# Patient Record
Sex: Female | Born: 1980 | Race: Black or African American | Hispanic: No | Marital: Single | Smoking: Never smoker
Health system: Southern US, Community
[De-identification: ages and names within clinical notes are randomized; demographics above are authoritative.]

## PROBLEM LIST (undated history)

## (undated) HISTORY — PX: CHOLECYSTECTOMY: SHX55

---

## 2015-03-09 ENCOUNTER — Emergency Department (INDEPENDENT_AMBULATORY_CARE_PROVIDER_SITE_OTHER): Payer: Medicaid Other

## 2015-03-09 ENCOUNTER — Encounter (HOSPITAL_COMMUNITY): Payer: Self-pay | Admitting: *Deleted

## 2015-03-09 ENCOUNTER — Emergency Department (HOSPITAL_COMMUNITY)
Admission: EM | Admit: 2015-03-09 | Discharge: 2015-03-09 | Disposition: A | Discharge status: Home / Self Care | Payer: Medicaid Other | Source: Home / Self Care | Attending: Family Medicine | Admitting: Family Medicine

## 2015-03-09 DIAGNOSIS — S92912A Unspecified fracture of left toe(s), initial encounter for closed fracture: Secondary | ICD-10-CM

## 2015-03-09 MED ORDER — HYDROCODONE-ACETAMINOPHEN 5-325 MG PO TABS: 1.0000 | ORAL_TABLET | Freq: Four times a day (QID) | ORAL | Status: DC | PRN

## 2015-03-09 NOTE — Discharge Instructions (Signed)
Ice , tape toes and wear shoe and use pain medicine as needed

## 2015-03-09 NOTE — ED Provider Notes (Signed)
CSN: [id number]     Arrival date & time 03/09/15  1427 History   First MD Initiated Contact with Patient 03/09/15 1505     Chief Complaint  Patient presents with  . Toe Injury   (Consider location/radiation/quality/duration/timing/severity/associated sxs/prior Treatment) Patient is a 34 y.o. female presenting with toe pain. The history is provided by the patient.  Toe Pain This is a new problem. The current episode started 3 to 5 hours ago (kicked while barefoot the bedpost to left 4th toe.). The problem has not changed since onset.   History reviewed. No pertinent past medical history. History reviewed. No pertinent past surgical history. History reviewed. No pertinent family history. History  Substance Use Topics  . Smoking status: Never Smoker   . Smokeless tobacco: Not on file  . Alcohol Use: No   OB History    No data available     Review of Systems  Constitutional: Negative.   Musculoskeletal: Positive for joint swelling and gait problem.  Skin: Negative.     Allergies  Review of patient's allergies indicates no known allergies.  Home Medications   Prior to Admission medications   Not on File   BP 128/89 mmHg  Pulse 82  Temp(Src) 98 F (36.7 C) (Oral)  Resp 18  SpO2 100%  LMP 03/08/2015 Physical Exam  Constitutional: She is oriented to person, place, and time. She appears well-developed and well-nourished.  Musculoskeletal: She exhibits tenderness.  Sl lat deformity to left 4th toe, skin nl. Limited rom.  Neurological: She is alert and oriented to person, place, and time.  Skin: Skin is warm and dry.  Nursing note and vitals reviewed.   ED Course  Procedures (including critical care time) Labs Review Labs Reviewed - No data to display  Imaging Review Dg Toe 4th Left  03/09/2015   CLINICAL DATA:  Patient hit left foot on table leg today; Pain and deformity 4th toe of left foot; Pt states 1 yr ago she fractured 5th toe of the left foot  EXAM: LEFT  FOURTH TOE  COMPARISON:  None.  FINDINGS: Oblique fracture of the distal aspect of the proximal phalanx of the left fourth toe. Extends from the medial mid shaft to the lateral distal aspect, but does not involve the articular surface. Fracture is slightly displaced with the distal fracture fragment displacing medially by 2 mm with slight lateral angulation. There is no comminution.  No other fractures.  No dislocation.  IMPRESSION: Oblique fracture of the proximal phalanx of the left fourth toe as detailed.   Electronically Signed   By: Amie Portlandavid  Ormond M.D.   On: 03/09/2015 15:35     MDM   1. Toe fracture, left, closed, initial encounter        Linna HoffJames D Tandy Lewin, MD 03/09/15 1547

## 2015-03-09 NOTE — ED Notes (Signed)
Pt  Reports    She injured  Her  l   4  Th  Toe     She  Reports she  Stubbed  It on a  Bed post     She  Has  Pain  When  She  Stands on the  Affected  Toe  Or    Moves  The  Toe

## 2015-07-14 ENCOUNTER — Emergency Department (HOSPITAL_COMMUNITY)
Admission: EM | Admit: 2015-07-14 | Discharge: 2015-07-14 | Disposition: A | Discharge status: Home / Self Care | Payer: Medicaid Other | Source: Home / Self Care | Attending: Emergency Medicine | Admitting: Emergency Medicine

## 2015-07-14 ENCOUNTER — Encounter (HOSPITAL_COMMUNITY): Payer: Self-pay | Admitting: Emergency Medicine

## 2015-07-14 ENCOUNTER — Emergency Department (INDEPENDENT_AMBULATORY_CARE_PROVIDER_SITE_OTHER): Payer: Medicaid Other

## 2015-07-14 DIAGNOSIS — M25561 Pain in right knee: Secondary | ICD-10-CM

## 2015-07-14 MED ORDER — DICLOFENAC SODIUM 50 MG PO TBEC: 50.0000 mg | DELAYED_RELEASE_TABLET | Freq: Three times a day (TID) | ORAL | Status: DC | PRN

## 2015-07-14 NOTE — ED Notes (Signed)
Patient stated she wanted xray done of her knee.   Per honig ok to get xray.   After xray patient will wait in lobby for results

## 2015-07-14 NOTE — Discharge Instructions (Signed)
You likely have arthritis in your knee that has caused a meniscal tear. Wear the knee brace when you are up and around. Take diclofenac 3 times a day as needed for pain. Ice your knee at the end of the day. If things are not improving in the next week, please follow-up with orthopedics.

## 2015-07-14 NOTE — ED Notes (Signed)
C/o right leg and arm pain for a month States she has leg stiffness in the morning States she hears pops when she walks Does have hx of arthritis  States she works at [employer] and does do some heavy lifting sometimes

## 2015-07-14 NOTE — ED Provider Notes (Signed)
CSN: [id number]     Arrival date & time 07/14/15  1730 History   First MD Initiated Contact with Patient 07/14/15 1850     Chief Complaint  Patient presents with  . Leg Pain  . Arm Pain   (Consider location/radiation/quality/duration/timing/severity/associated sxs/prior Treatment) HPI  She is a 34 year old woman here for evaluation of right knee pain and right forearm pain. She states she was told she had arthritis when she was in her 26s. She reports a long history appearing popping and cracking in her knees. Over the last month she has had worsening pain in her right knee. She states the pain is located in the lateral part of the knee. Sometimes the posterior and she will feel a fluid patch in the back of her knee. It will go down with rest and elevation. However, walking and bending her knee can be quite painful. Her right elbow and forearm will also hurt when her knee hurts sometimes. Today, she noted some skin changes on the medial elbow. These are described as darker skin patches.  She has been taking over-the-counter ibuprofen with some improvement.  History reviewed. No pertinent past medical history. History reviewed. No pertinent past surgical history. History reviewed. No pertinent family history. Social History  Substance Use Topics  . Smoking status: Never Smoker   . Smokeless tobacco: None  . Alcohol Use: No   OB History    No data available     Review of Systems As in history of present illness Allergies  Review of patient's allergies indicates no known allergies.  Home Medications   Prior to Admission medications   Medication Sig Start Date End Date Taking? Authorizing Provider  diclofenac (VOLTAREN) 50 MG EC tablet Take 1 tablet (50 mg total) by mouth 3 (three) times daily as needed for moderate pain. 07/14/15   Charm Rings, MD  HYDROcodone-acetaminophen (NORCO/VICODIN) 5-325 MG per tablet Take 1 tablet by mouth every 6 (six) hours as needed. Prn pain 03/09/15    Linna Hoff, MD   BP 128/78 mmHg  Pulse 101  Temp(Src) 98.4 F (36.9 C) (Oral)  Resp 16  SpO2 100%  LMP 07/13/2015 Physical Exam  Constitutional: She is oriented to person, place, and time. She appears well-developed and well-nourished. No distress.  Cardiovascular: Normal rate.   Pulmonary/Chest: Effort normal.  Musculoskeletal:  Right knee: No erythema or edema. No appreciable joint effusion. She does have some crepitus. No joint laxity. Positive McMurray's. Right arm. No erythema or edema. 2+ radial pulse. Full active range of motion of elbow and wrist without pain.  Neurological: She is alert and oriented to person, place, and time.  Skin:  She does have some hyperpigmentation along the medial aspect of her right elbow    ED Course  Procedures (including critical care time) Labs Review Labs Reviewed - No data to display  Imaging Review Dg Knee Complete 4 Views Right  07/14/2015   CLINICAL DATA:  Knee pain  EXAM: RIGHT KNEE - COMPLETE 4+ VIEW  COMPARISON:  None.  FINDINGS: Four views of the right knee submitted. No acute fracture or subluxation. Mid in the left ago. Anterior global a plate. No joint effusion.  IMPRESSION: No acute fracture or subluxation. Minimal narrowing of medial joint compartment   Electronically Signed   By: Natasha Mead M.D.   On: 07/14/2015 20:09     MDM   1. Right knee pain    I suspect she has some degenerative changes due to obesity.  Given her exam, I suspect she has a degenerative tear of her lateral meniscus. Knee sleeve given. Diclofenac 3 times a day as needed. Follow-up with orthopedics if no improvement in 1 week.    Charm Rings, MD 07/14/15 2013

## 2015-07-21 NOTE — ED Notes (Signed)
Call from pharmacy, asking for Rx that will be covered under patient insurance plan. Spoke ew Dr Kirby Funk, who authorized Meloxicacm 15 mg , PO, #30, use  1 pill QD for symptoms, NR. Spoke directly w pharmacist to relay new RX information

## 2015-10-30 ENCOUNTER — Ambulatory Visit: Payer: Medicaid Other | Admitting: Student

## 2016-09-14 ENCOUNTER — Ambulatory Visit (HOSPITAL_COMMUNITY)
Admission: EM | Admit: 2016-09-14 | Discharge: 2016-09-14 | Disposition: A | Discharge status: Home / Self Care | Payer: Medicaid Other | Attending: Family Medicine | Admitting: Family Medicine

## 2016-09-14 ENCOUNTER — Encounter (HOSPITAL_COMMUNITY): Payer: Self-pay | Admitting: Emergency Medicine

## 2016-09-14 DIAGNOSIS — J029 Acute pharyngitis, unspecified: Secondary | ICD-10-CM | POA: Diagnosis not present

## 2016-09-14 DIAGNOSIS — Z77120 Contact with and (suspected) exposure to mold (toxic): Secondary | ICD-10-CM

## 2016-09-14 LAB — POCT RAPID STREP A: Streptococcus, Group A Screen (Direct): NEGATIVE

## 2016-09-14 NOTE — ED Provider Notes (Signed)
CSN: [id number]     Arrival date & time 09/14/16  1525 History   None    Chief Complaint  Patient presents with  . Sore Throat   (Consider location/radiation/quality/duration/timing/severity/associated sxs/prior Treatment) HPI History obtained from patient:   LOCATION:throat SEVERITY:6 DURATION:1 day CONTEXT:sudden onset, exposed at school QUALITY:scratchy MODIFYING FACTORS:OTC meds without relief ASSOCIATED SYMPTOMS:hurts to swallow, fever, runny nose, cough TIMING:now constant  History reviewed. No pertinent past medical history. Past Surgical History:  Procedure Laterality Date  . CHOLECYSTECTOMY     History reviewed. No pertinent family history. Social History  Substance Use Topics  . Smoking status: Never Smoker  . Smokeless tobacco: Never Used  . Alcohol use No   OB History    No data available     Review of Systems  Denies: HEADACHE, NAUSEA, ABDOMINAL PAIN, CHEST PAIN, CONGESTION, DYSURIA, SHORTNESS OF BREATH  Allergies  Review of patient's allergies indicates no known allergies.  Home Medications   Prior to Admission medications   Medication Sig Start Date End Date Taking? Authorizing Provider  diclofenac (VOLTAREN) 50 MG EC tablet Take 1 tablet (50 mg total) by mouth 3 (three) times daily as needed for moderate pain. 07/14/15   Charm RingsErin J Honig, MD  HYDROcodone-acetaminophen (NORCO/VICODIN) 5-325 MG per tablet Take 1 tablet by mouth every 6 (six) hours as needed. Prn pain 03/09/15   Linna HoffJames D Kindl, MD   Meds Ordered and Administered this Visit  Medications - No data to display  BP 110/72 (BP Location: Left Arm)   Pulse 89   Temp 98.7 F (37.1 C) (Oral)   Resp 20   LMP 09/14/2016   SpO2 100%  No data found.   Physical Exam NURSES NOTES AND VITAL SIGNS REVIEWED. CONSTITUTIONAL: Well developed, well nourished, no acute distress HEENT: normocephalic, atraumatic EYES: Conjunctiva normal NECK:normal ROM, supple, no adenopathy PULMONARY:No respiratory  distress, normal effort ABDOMINAL: Soft, ND, NT BS+, No CVAT MUSCULOSKELETAL: Normal ROM of all extremities,  SKIN: warm and dry without rash PSYCHIATRIC: Mood and affect, behavior are normal;  Urgent Care Course   Clinical Course    Procedures (including critical care time)  Labs Review Labs Reviewed - No data to display  Imaging Review No results found.   Visual Acuity Review  Right Eye Distance:   Left Eye Distance:   Bilateral Distance:    Right Eye Near:   Left Eye Near:    Bilateral Near:         MDM   1. Pharyngitis, unspecified etiology   2. Mold exposure     Patient is reassured that there are no issues that require transfer to higher level of care at this time or additional tests. Patient is advised to continue home symptomatic treatment. Patient is advised that if there are new or worsening symptoms to attend the emergency department, contact primary care provider, or return to UC. Instructions of care provided discharged home in stable condition.    THIS NOTE WAS GENERATED USING A VOICE RECOGNITION SOFTWARE PROGRAM. ALL REASONABLE EFFORTS  WERE MADE TO PROOFREAD THIS DOCUMENT FOR ACCURACY.  I have verbally reviewed the discharge instructions with the patient. A printed AVS was given to the patient.  All questions were answered prior to discharge.      Tharon AquasFrank C Lelend Heinecke, PA 09/14/16 [phone]

## 2016-09-14 NOTE — Discharge Instructions (Signed)
YOU MAY NEED TO DEHUMIDIFY THE ROOM WHERE THE MOLD IS LOCATED.   LOOK ON THE WEB AT CDC.GOV/MOLD AND THIS WILL GIVE YOU GOOD INFORMATION ABOUT MOLD.

## 2016-09-14 NOTE — ED Triage Notes (Signed)
[facility] with a complaint of an itchy throat, burning watery eyes and headaches x 1 week that the patient believed to be attributed to mold in her home.

## 2016-09-17 LAB — CULTURE, GROUP A STREP (THRC)

## 2017-02-02 ENCOUNTER — Ambulatory Visit (HOSPITAL_COMMUNITY)
Admission: EM | Admit: 2017-02-02 | Discharge: 2017-02-02 | Disposition: A | Discharge status: Home / Self Care | Payer: Medicaid Other | Attending: Family Medicine | Admitting: Family Medicine

## 2017-02-02 ENCOUNTER — Encounter (HOSPITAL_COMMUNITY): Payer: Self-pay | Admitting: *Deleted

## 2017-02-02 DIAGNOSIS — J208 Acute bronchitis due to other specified organisms: Secondary | ICD-10-CM | POA: Diagnosis not present

## 2017-02-02 DIAGNOSIS — R05 Cough: Secondary | ICD-10-CM

## 2017-02-02 DIAGNOSIS — R059 Cough, unspecified: Secondary | ICD-10-CM

## 2017-02-02 DIAGNOSIS — R062 Wheezing: Secondary | ICD-10-CM

## 2017-02-02 MED ORDER — ALBUTEROL SULFATE HFA 108 (90 BASE) MCG/ACT IN AERS: 1.0000 | INHALATION_SPRAY | Freq: Four times a day (QID) | RESPIRATORY_TRACT | 0 refills | Status: DC | PRN

## 2017-02-02 MED ORDER — METHYLPREDNISOLONE 4 MG PO TBPK
ORAL_TABLET | ORAL | 0 refills | Status: DC
Start: 2017-02-02 — End: 2018-10-02

## 2017-02-02 MED ORDER — BENZONATATE 100 MG PO CAPS: 200.0000 mg | ORAL_CAPSULE | Freq: Three times a day (TID) | ORAL | 0 refills | Status: DC | PRN

## 2017-02-02 MED ORDER — AZITHROMYCIN 250 MG PO TABS: 250.0000 mg | ORAL_TABLET | Freq: Every day | ORAL | 0 refills | Status: DC

## 2017-02-02 NOTE — ED Triage Notes (Signed)
Cough  X  1   Week          Greenish  Sputum    Cough  Is   So  Severe  She  Is  Urinating  On  Herself   ough  Is  Worse  At  Night

## 2017-02-02 NOTE — ED Provider Notes (Signed)
CSN: [id number]     Arrival date & time 02/02/17  1006 History   First MD Initiated Contact with Patient 02/02/17 1140     Chief Complaint  Patient presents with  . Cough   (Consider location/radiation/quality/duration/timing/severity/associated sxs/prior Treatment) Patient c/o cough x 1 week.  She has used inhalers for sob and wheezing in past but is currently out.  She has a lot of night time coughing and she is coughing up green sputum. She states she is also wheezing.  She is no currently smoking.  The sx's have been present for a week.   The history is provided by the patient.  Cough  Cough characteristics:  Productive Sputum characteristics:  Yellow and green Severity:  Moderate Onset quality:  Sudden Duration:  1 week Timing:  Constant Progression:  Worsening Chronicity:  New Smoker: yes   Context: upper respiratory infection and weather changes   Relieved by:  Nothing Worsened by:  Nothing Ineffective treatments:  None tried   History reviewed. No pertinent past medical history. Past Surgical History:  Procedure Laterality Date  . CHOLECYSTECTOMY     History reviewed. No pertinent family history. Social History  Substance Use Topics  . Smoking status: Never Smoker  . Smokeless tobacco: Never Used  . Alcohol use No   OB History    No data available     Review of Systems  Constitutional: Positive for fatigue.  HENT: Negative.   Eyes: Negative.   Respiratory: Positive for cough.   Cardiovascular: Negative.   Gastrointestinal: Negative.   Endocrine: Negative.   Genitourinary: Negative.   Musculoskeletal: Negative.   Allergic/Immunologic: Negative.   Neurological: Negative.   Hematological: Negative.   Psychiatric/Behavioral: Negative.     Allergies  Patient has no known allergies.  Home Medications   Prior to Admission medications   Medication Sig Start Date End Date Taking? Authorizing Provider  albuterol (PROVENTIL HFA;VENTOLIN HFA) 108 (90  Base) MCG/ACT inhaler Inhale 1-2 puffs into the lungs every 6 (six) hours as needed for wheezing or shortness of breath. 02/02/17   Deatra Canter, FNP  azithromycin (ZITHROMAX) 250 MG tablet Take 1 tablet (250 mg total) by mouth daily. Take first 2 tablets together, then 1 every day until finished. 02/02/17   Deatra Canter, FNP  benzonatate (TESSALON) 100 MG capsule Take 2 capsules (200 mg total) by mouth 3 (three) times daily as needed for cough. 02/02/17   Deatra Canter, FNP  diclofenac (VOLTAREN) 50 MG EC tablet Take 1 tablet (50 mg total) by mouth 3 (three) times daily as needed for moderate pain. 07/14/15   Charm Rings, MD  HYDROcodone-acetaminophen (NORCO/VICODIN) 5-325 MG per tablet Take 1 tablet by mouth every 6 (six) hours as needed. Prn pain 03/09/15   Linna Hoff, MD  methylPREDNISolone (MEDROL DOSEPAK) 4 MG TBPK tablet Take 6-5-4-3-2-1 po qd 02/02/17   Deatra Canter, FNP   Meds Ordered and Administered this Visit  Medications - No data to display  BP 124/76 (BP Location: Right Arm)   Pulse 84   Temp 99.3 F (37.4 C) (Oral)   Resp 18   LMP 12/27/2016 Comment: btl  SpO2 100%  No data found.   Physical Exam  Constitutional: She appears well-developed and well-nourished.  HENT:  Head: Normocephalic and atraumatic.  Right Ear: External ear normal.  Left Ear: External ear normal.  Mouth/Throat: Oropharynx is clear and moist.  Eyes: Conjunctivae and EOM are normal. Pupils are equal, round, and reactive to  light.  Neck: Normal range of motion. Neck supple.  Cardiovascular: Normal rate, regular rhythm and normal heart sounds.   Pulmonary/Chest: Effort normal. She has wheezes.  Abdominal: Soft. Bowel sounds are normal.  Nursing note and vitals reviewed.   Urgent Care Course     Procedures (including critical care time)  Labs Review Labs Reviewed - No data to display  Imaging Review No results found.   Visual Acuity Review  Right Eye Distance:   Left Eye  Distance:   Bilateral Distance:    Right Eye Near:   Left Eye Near:    Bilateral Near:         MDM   1. Acute bronchitis due to other specified organisms   2. Cough   3. Wheezing    Zpak Medrol dose pack 4mg  #21 Tessalon perles Albuterol MDI  Push po fluids, rest, tylenol and motrin otc prn as directed for fever, arthralgias, and myalgias.  Follow up prn if sx's continue or persist.    Deatra CanterWilliam J Oxford, FNP 02/02/17 1212

## 2017-12-26 ENCOUNTER — Ambulatory Visit (HOSPITAL_COMMUNITY)
Admission: EM | Admit: 2017-12-26 | Discharge: 2017-12-26 | Disposition: A | Discharge status: Home / Self Care | Payer: Medicaid Other | Attending: Family Medicine | Admitting: Family Medicine

## 2017-12-26 ENCOUNTER — Other Ambulatory Visit: Payer: Self-pay

## 2017-12-26 ENCOUNTER — Encounter (HOSPITAL_COMMUNITY): Payer: Self-pay | Admitting: Physician Assistant

## 2017-12-26 DIAGNOSIS — B9789 Other viral agents as the cause of diseases classified elsewhere: Secondary | ICD-10-CM

## 2017-12-26 DIAGNOSIS — J069 Acute upper respiratory infection, unspecified: Secondary | ICD-10-CM | POA: Diagnosis not present

## 2017-12-26 MED ORDER — BENZONATATE 100 MG PO CAPS: 100.0000 mg | ORAL_CAPSULE | Freq: Three times a day (TID) | ORAL | 0 refills | Status: DC

## 2017-12-26 MED ORDER — FLUTICASONE PROPIONATE 50 MCG/ACT NA SUSP: 2.0000 | Freq: Every day | NASAL | 0 refills | Status: DC

## 2017-12-26 MED ORDER — IPRATROPIUM BROMIDE 0.06 % NA SOLN: 2.0000 | Freq: Four times a day (QID) | NASAL | 0 refills | Status: DC

## 2017-12-26 MED ORDER — CETIRIZINE-PSEUDOEPHEDRINE ER 5-120 MG PO TB12: 1.0000 | ORAL_TABLET | Freq: Every day | ORAL | 0 refills | Status: DC

## 2017-12-26 MED ORDER — ALBUTEROL SULFATE HFA 108 (90 BASE) MCG/ACT IN AERS: 1.0000 | INHALATION_SPRAY | Freq: Four times a day (QID) | RESPIRATORY_TRACT | 0 refills | Status: DC | PRN

## 2017-12-26 NOTE — Discharge Instructions (Signed)
Tessalon for cough. Albuterol as needed for shortness of breath, wheezing. Start flonase, atrovent nasal spray, zyrtec-D for nasal congestion/drainage. You can use over the counter nasal saline rinse such as neti pot for nasal congestion. Keep hydrated, your urine should be clear to pale yellow in color. Tylenol/motrin for fever and pain. Monitor for any worsening of symptoms, chest pain, shortness of breath, wheezing, swelling of the throat, follow up for reevaluation.   For sore throat try using a honey-based tea. Use 3 teaspoons of honey with juice squeezed from half lemon. Place shaved pieces of ginger into 1/2-1 cup of water and warm over stove top. Then mix the ingredients and repeat every 4 hours as needed.

## 2017-12-26 NOTE — ED Provider Notes (Signed)
[facility]    CSN: [id number] Arrival date & time: 12/26/17  1302     History   Chief Complaint Chief Complaint  Patient presents with  . Cough    HPI Kim Cross is a 37 y.o. female.   37 year old female comes in for 1 week history of URI symptoms. Productive cough, nasal congestion, rhinorrhea. Denies fever, chills, night sweats. Denies sore throat, ear pain. States coughing worse at night. otc cold medication without relief. Never smoker.       History reviewed. No pertinent past medical history.  There are no active problems to display for this patient.   Past Surgical History:  Procedure Laterality Date  . CHOLECYSTECTOMY      OB History    No data available       Home Medications    Prior to Admission medications   Medication Sig Start Date End Date Taking? Authorizing Provider  albuterol (PROVENTIL HFA;VENTOLIN HFA) 108 (90 Base) MCG/ACT inhaler Inhale 1-2 puffs into the lungs every 6 (six) hours as needed for wheezing or shortness of breath. 12/26/17   Cathie HoopsYu, Louvina Cleary V, PA-C  benzonatate (TESSALON) 100 MG capsule Take 1 capsule (100 mg total) by mouth every 8 (eight) hours. 12/26/17   Cathie HoopsYu, Cameron Katayama V, PA-C  cetirizine-pseudoephedrine (ZYRTEC-D) 5-120 MG tablet Take 1 tablet by mouth daily. 12/26/17   Cathie HoopsYu, Zymier Rodgers V, PA-C  diclofenac (VOLTAREN) 50 MG EC tablet Take 1 tablet (50 mg total) by mouth 3 (three) times daily as needed for moderate pain. Patient not taking: Reported on 12/26/2017 07/14/15   Charm RingsHonig, Erin J, MD  fluticasone [facility](FLONASE) 50 MCG/ACT nasal spray Place 2 sprays into both nostrils daily. 12/26/17   Cathie HoopsYu, Alaysiah Browder V, PA-C  HYDROcodone-acetaminophen (NORCO/VICODIN) 5-325 MG per tablet Take 1 tablet by mouth every 6 (six) hours as needed. Prn pain Patient not taking: Reported on 12/26/2017 03/09/15   Linna HoffKindl, James D, MD  ipratropium (ATROVENT) 0.06 % nasal spray Place 2 sprays into both nostrils 4 (four) times daily. 12/26/17   Belinda FisherYu, Marinda Tyer V, PA-C  methylPREDNISolone (MEDROL  DOSEPAK) 4 MG TBPK tablet Take 6-5-4-3-2-1 po qd Patient not taking: Reported on 12/26/2017 02/02/17   Deatra Canterxford, William J, FNP    Family History No family history on file.  Social History Social History   Tobacco Use  . Smoking status: Never Smoker  . Smokeless tobacco: Never Used  Substance Use Topics  . Alcohol use: No  . Drug use: No     Allergies   Patient has no known allergies.   Review of Systems Review of Systems  Reason unable to perform ROS: See HPI as above.     Physical Exam Triage Vital Signs ED Triage Vitals [12/26/17 1427]  Enc Vitals Group     BP (!) 140/99     Pulse Rate 94     Resp 18     Temp 98.3 F (36.8 C)     Temp src      SpO2 100 %     Weight      Height      Head Circumference      Peak Flow      Pain Score      Pain Loc      Pain Edu?      Excl. in GC?    No data found.  Updated Vital Signs BP (!) 140/99   Pulse 94   Temp 98.3 F (36.8 C)   Resp 18  SpO2 100%   Physical Exam  Constitutional: She is oriented to person, place, and time. She appears well-developed and well-nourished. No distress.  HENT:  Head: Normocephalic and atraumatic.  Right Ear: External ear and ear canal normal.  Left Ear: External ear and ear canal normal. Tympanic membrane is erythematous. Tympanic membrane is not bulging.  Nose: Mucosal edema and rhinorrhea present. Right sinus exhibits no maxillary sinus tenderness and no frontal sinus tenderness. Left sinus exhibits no maxillary sinus tenderness and no frontal sinus tenderness.  Mouth/Throat: Uvula is midline, oropharynx is clear and moist and mucous membranes are normal.  Right ear with cerumen impaction, TM not visible.   Eyes: Conjunctivae are normal. Pupils are equal, round, and reactive to light.  Neck: Normal range of motion. Neck supple.  Cardiovascular: Normal rate, regular rhythm and normal heart sounds. Exam reveals no gallop and no friction rub.  No murmur heard. Pulmonary/Chest:  Effort normal and breath sounds normal. She has no decreased breath sounds. She has no wheezes. She has no rhonchi. She has no rales.  Lymphadenopathy:    She has no cervical adenopathy.  Neurological: She is alert and oriented to person, place, and time.  Skin: Skin is warm and dry.  Psychiatric: She has a normal mood and affect. Her behavior is normal. Judgment normal.     UC Treatments / Results  Labs (all labs ordered are listed, but only abnormal results are displayed) Labs Reviewed - No data to display  EKG  EKG Interpretation None       Radiology No results found.  Procedures Procedures (including critical care time)  Medications Ordered in UC Medications - No data to display   Initial Impression / Assessment and Plan / UC Course  I have reviewed the triage vital signs and the nursing notes.  Pertinent labs & imaging results that were available during my care of the patient were reviewed by me and considered in my medical decision making (see chart for details).    Discussed with patient history and exam most consistent with viral URI. Symptomatic treatment as needed. Push fluids. Return precautions given.   Final Clinical Impressions(s) / UC Diagnoses   Final diagnoses:  Viral URI with cough    ED Discharge Orders        Ordered    albuterol (PROVENTIL HFA;VENTOLIN HFA) 108 (90 Base) MCG/ACT inhaler  Every 6 hours PRN     12/26/17 1441    benzonatate (TESSALON) 100 MG capsule  Every 8 hours     12/26/17 1441    fluticasone (FLONASE) 50 MCG/ACT nasal spray  Daily     12/26/17 1441    cetirizine-pseudoephedrine (ZYRTEC-D) 5-120 MG tablet  Daily     12/26/17 1441    ipratropium (ATROVENT) 0.06 % nasal spray  4 times daily     12/26/17 1441        Belinda Fisher, [place] 12/26/17 1446

## 2017-12-26 NOTE — ED Triage Notes (Signed)
Pt c/o coughing x1 week.  

## 2017-12-31 ENCOUNTER — Ambulatory Visit (HOSPITAL_COMMUNITY)
Admission: EM | Admit: 2017-12-31 | Discharge: 2017-12-31 | Disposition: A | Discharge status: Home / Self Care | Payer: Medicaid Other | Attending: Family Medicine | Admitting: Family Medicine

## 2017-12-31 ENCOUNTER — Encounter (HOSPITAL_COMMUNITY): Payer: Self-pay | Admitting: Emergency Medicine

## 2017-12-31 DIAGNOSIS — H6121 Impacted cerumen, right ear: Secondary | ICD-10-CM | POA: Diagnosis not present

## 2017-12-31 NOTE — Discharge Instructions (Signed)
Cerumen removed from ear today. No signs of infection. You can use flonase as needed if you continue to have ear fullness. Ear wax softener/hydrogen peroxide periodically can help prevent this from happening. Recheck as needed.

## 2017-12-31 NOTE — ED Triage Notes (Signed)
PT C/O: here for right ear fullness onset "couple of months".... Sx also include decreased hearing   Seen here on 12/26/17 for cold sx  A&O x4... NAD... Ambulatory

## 2017-12-31 NOTE — ED Provider Notes (Signed)
[facility]    CSN: [id number] Arrival date & time: 12/31/17  1355     History   Chief Complaint Chief Complaint  Patient presents with  . Ear Fullness    HPI Kim Cross is a 37 y.o. female.   37 year old female comes in for a few month history of right ear fullness with decreased hearing.  She was recently seen here for URI symptoms, which has since resolved.  She has been using Flonase as directed for her URI, and states has not helped with her right ear fullness.  Denies fever, chills, night sweats.  Denies cotton swab use, recent travels.      History reviewed. No pertinent past medical history.  There are no active problems to display for this patient.   Past Surgical History:  Procedure Laterality Date  . CHOLECYSTECTOMY      OB History    No data available       Home Medications    Prior to Admission medications   Medication Sig Start Date End Date Taking? Authorizing Provider  albuterol (PROVENTIL HFA;VENTOLIN HFA) 108 (90 Base) MCG/ACT inhaler Inhale 1-2 puffs into the lungs every 6 (six) hours as needed for wheezing or shortness of breath. 12/26/17  Yes Yu, Amy V, PA-C  benzonatate (TESSALON) 100 MG capsule Take 1 capsule (100 mg total) by mouth every 8 (eight) hours. 12/26/17  Yes Yu, Amy V, PA-C  cetirizine-pseudoephedrine (ZYRTEC-D) 5-120 MG tablet Take 1 tablet by mouth daily. 12/26/17  Yes Yu, Amy V, PA-C  diclofenac (VOLTAREN) 50 MG EC tablet Take 1 tablet (50 mg total) by mouth 3 (three) times daily as needed for moderate pain. 07/14/15  Yes Charm RingsHonig, Erin J, MD  fluticasone (FLONASE) 50 MCG/ACT nasal spray Place 2 sprays into both nostrils daily. 12/26/17  Yes Yu, Amy V, PA-C  HYDROcodone-acetaminophen (NORCO/VICODIN) 5-325 MG per tablet Take 1 tablet by mouth every 6 (six) hours as needed. Prn pain 03/09/15  Yes Kindl, Quita SkyeJames D, MD  ipratropium (ATROVENT) 0.06 % nasal spray Place 2 sprays into both nostrils 4 (four) times daily. 12/26/17  Yes Yu, Amy  V, PA-C  methylPREDNISolone (MEDROL DOSEPAK) 4 MG TBPK tablet Take 6-5-4-3-2-1 po qd 02/02/17  Yes Oxford, Anselm PancoastWilliam J, FNP    Family History History reviewed. No pertinent family history.  Social History Social History   Tobacco Use  . Smoking status: Never Smoker  . Smokeless tobacco: Never Used  Substance Use Topics  . Alcohol use: No  . Drug use: No     Allergies   Patient has no known allergies.   Review of Systems Review of Systems  Reason unable to perform ROS: See HPI as above.     Physical Exam Triage Vital Signs ED Triage Vitals [12/31/17 1528]  Enc Vitals Group     BP 115/62     Pulse Rate 79     Resp 16     Temp 98.6 F (37 C)     Temp Source Oral     SpO2 100 %     Weight      Height      Head Circumference      Peak Flow      Pain Score      Pain Loc      Pain Edu?      Excl. in GC?    No data found.  Updated Vital Signs BP 115/62 (BP Location: Left Arm)   Pulse 79   Temp 98.6  F (37 C) (Oral)   Resp 16   LMP 12/30/2017   SpO2 100%   Physical Exam  Constitutional: She is oriented to person, place, and time. She appears well-developed and well-nourished. No distress.  HENT:  Head: Normocephalic and atraumatic.  Right Ear: External ear and ear canal normal.  Left Ear: Tympanic membrane, external ear and ear canal normal. Tympanic membrane is not erythematous and not bulging.  No tenderness on palpation of tragus. Cerumen impaction of the right ear, TM not visible.   Post ear irrigation, right TM without erythema, bulging, mid ear effusion.   Eyes: Conjunctivae are normal. Pupils are equal, round, and reactive to light.  Neurological: She is alert and oriented to person, place, and time.     UC Treatments / Results  Labs (all labs ordered are listed, but only abnormal results are displayed) Labs Reviewed - No data to display  EKG  EKG Interpretation None       Radiology No results found.  Procedures Procedures  (including critical care time)  Medications Ordered in UC Medications - No data to display   Initial Impression / Assessment and Plan / UC Course  I have reviewed the triage vital signs and the nursing notes.  Pertinent labs & imaging results that were available during my care of the patient were reviewed by me and considered in my medical decision making (see chart for details).    Patient symptoms resolved after ear irrigation.  No signs of infection. Discussed ways to prevent cerumen impaction. Recheck as needed. Patient expresses understanding and agrees to plan.   Final Clinical Impressions(s) / UC Diagnoses   Final diagnoses:  Impacted cerumen of right ear    ED Discharge Orders    None        Belinda Fisher, PA-C 12/31/17 1611

## 2018-01-21 ENCOUNTER — Ambulatory Visit (HOSPITAL_COMMUNITY)
Admission: EM | Admit: 2018-01-21 | Discharge: 2018-01-21 | Disposition: A | Discharge status: Home / Self Care | Payer: Medicaid Other | Attending: Family Medicine | Admitting: Family Medicine

## 2018-01-21 ENCOUNTER — Other Ambulatory Visit: Payer: Self-pay

## 2018-01-21 ENCOUNTER — Encounter (HOSPITAL_COMMUNITY): Payer: Self-pay

## 2018-01-21 DIAGNOSIS — R05 Cough: Secondary | ICD-10-CM | POA: Diagnosis not present

## 2018-01-21 DIAGNOSIS — R059 Cough, unspecified: Secondary | ICD-10-CM

## 2018-01-21 MED ORDER — HYDROCODONE-HOMATROPINE 5-1.5 MG/5ML PO SYRP: 5.0000 mL | ORAL_SOLUTION | Freq: Four times a day (QID) | ORAL | 0 refills | Status: DC | PRN

## 2018-01-21 MED ORDER — AZITHROMYCIN 250 MG PO TABS: 250.0000 mg | ORAL_TABLET | Freq: Every day | ORAL | 0 refills | Status: DC

## 2018-01-21 NOTE — ED Triage Notes (Signed)
Pt presents today with the same issue she had on 12/31/17. States she is no better. Has a really bad productive cough with green thick mucus. Has SOB and wheezing. States she had some chills last night. Has used nasal spray and inhaler that was given at last visit with no relief.

## 2018-01-21 NOTE — Discharge Instructions (Signed)
Be aware, your cough medication may cause drowsiness. Please do not drive, operate heavy machinery or make important decisions while on this medication, it can cloud your judgement.  Please see your primary doctor or return here if not improving over the next one week.

## 2018-01-21 NOTE — ED Provider Notes (Signed)
  [facility]   [id number] 01/21/18 Arrival Time: 1202  ASSESSMENT & PLAN:  1. Cough     Meds ordered this encounter  Medications  . HYDROcodone-homatropine (HYCODAN) 5-1.5 MG/5ML syrup    Sig: Take 5 mLs by mouth every 6 (six) hours as needed for cough.    Dispense:  90 mL    Refill:  0  . azithromycin (ZITHROMAX) 250 MG tablet    Sig: Take 1 tablet (250 mg total) by mouth daily. Take first 2 tablets together, then 1 every day until finished.    Dispense:  6 tablet    Refill:  0   Given duration of symptoms. Cough medication sedation precautions. OTC symptom care as needed. May f/u with PCP or here as needed.  Reviewed expectations re: course of current medical issues. Questions answered. Outlined signs and symptoms indicating need for more acute intervention. Patient verbalized understanding. After Visit Summary given.   SUBJECTIVE: History from: patient.  Kim Cross is a 37 y.o. female who presents with complaint of a persistent dry, hacking cough. Onset gradual, approximately 3-4 week ago. Overall fatigued secondary to decreased sleep secondary to persistent coughing. SOB: none. Wheezing: she questions at times; has inhaler at home but hasn't needed to use. Fever: no. Overall normal PO intake without n/v. Sick contacts: no. OTC treatment: Mucinex DM without relief.  Social History   Tobacco Use  Smoking Status Never Smoker  Smokeless Tobacco Never Used    ROS: As per HPI.   OBJECTIVE:  Vitals:   01/21/18 1227  BP: 138/72  Pulse: 83  Resp: 16  Temp: 98.4 F (36.9 C)  TempSrc: Oral  SpO2: 100%    General appearance: alert; appears fatigued HEENT: nasal congestion; clear runny nose; throat irritation secondary to post-nasal drainage Neck: supple without LAD Lungs: unlabored respirations, symmetrical air entry; cough: mild to moderate; no respiratory distress Skin: warm and dry Psychological: alert and cooperative; normal mood and  affect  No Known Allergies  Social History   Socioeconomic History  . Marital status: Single    Spouse name: Not on file  . Number of children: Not on file  . Years of education: Not on file  . Highest education level: Not on file  Social Needs  . Financial resource strain: Not on file  . Food insecurity - worry: Not on file  . Food insecurity - inability: Not on file  . Transportation needs - medical: Not on file  . Transportation needs - non-medical: Not on file  Occupational History  . Not on file  Tobacco Use  . Smoking status: Never Smoker  . Smokeless tobacco: Never Used  Substance and Sexual Activity  . Alcohol use: No  . Drug use: No  . Sexual activity: Not on file  Other Topics Concern  . Not on file  Social History Narrative  . Not on file           Mardella LaymanHagler, Lucile Didonato, MD 01/21/18 1318

## 2018-10-03 ENCOUNTER — Ambulatory Visit (INDEPENDENT_AMBULATORY_CARE_PROVIDER_SITE_OTHER): Payer: Medicaid Other | Admitting: Family Medicine

## 2018-10-03 ENCOUNTER — Encounter: Payer: Self-pay | Admitting: Family Medicine

## 2018-10-03 VITALS — BP 145/95 | HR 87 | Resp 17 | Ht 68.0 in | Wt 249.4 lb

## 2018-10-03 DIAGNOSIS — M25551 Pain in right hip: Secondary | ICD-10-CM

## 2018-10-03 DIAGNOSIS — G8929 Other chronic pain: Secondary | ICD-10-CM

## 2018-10-03 DIAGNOSIS — M25561 Pain in right knee: Secondary | ICD-10-CM | POA: Diagnosis not present

## 2018-10-03 DIAGNOSIS — Z7689 Persons encountering health services in other specified circumstances: Secondary | ICD-10-CM

## 2018-10-03 DIAGNOSIS — M25562 Pain in left knee: Secondary | ICD-10-CM

## 2018-10-03 DIAGNOSIS — M25552 Pain in left hip: Secondary | ICD-10-CM

## 2018-10-03 NOTE — Patient Instructions (Addendum)
Thank you for choosing [facility] Care at [facility] to be your medical home!    Kim Cross was seen by Joaquin Courts, FNP today.   Jaedan Herrera's primary care provider is Bing Neighbors, FNP.   For the best care possible, you should try to see Joaquin Courts, FNP-C whenever you come to the clinic.   We look forward to seeing you again soon!  If you have any questions about your visit today, please call us at [phone] or feel free to reach your primary care provider via MyChart.      Musculoskeletal Pain Musculoskeletal pain is muscle and bone aches and pains. This pain can occur in any part of the body. Follow these instructions at home:  Only take medicines for pain, discomfort, or fever as told by your health care provider.  You may continue all activities unless the activities cause more pain. When the pain lessens, slowly resume normal activities. Gradually increase the intensity and duration of the activities or exercise.  During periods of severe pain, bed rest may be helpful. Lie or sit in any position that is comfortable, but get out of bed and walk around at least every several hours.  If directed, put ice on the injured area. ? Put ice in a plastic bag. ? Place a towel between your skin and the bag. ? Leave the ice on for 20 minutes, 2-3 times a day. Contact a health care provider if:  Your pain is getting worse.  Your pain is not relieved with medicines.  You lose function in the area of the pain if the pain is in your arms, legs, or neck. This information is not intended to replace advice given to you by your health care provider. Make sure you discuss any questions you have with your health care provider. Document Released: 11/08/2005 Document Revised: 04/20/2016 Document Reviewed: 07/13/2013 Elsevier Interactive Patient Education  2017 [employer].

## 2018-10-03 NOTE — Progress Notes (Signed)
Kim SalinaSynovia Cornacchia, is a 37 y.o. female  ZHY:[id number]:[id number]  EXB:[id number]  DOB - 03/29/1981  CC:  Chief Complaint  Patient presents with  . Establish Care  . Knee Pain    R knee pain x months. has a hx of a torn meniscus in the same knee. has swelling.       HPI: Kim Cross is a 37 y.o. female is here today to establish care.   Kim Cross has Chronic pain of right knee and Pain of both hip joints on their problem list.     Today's visit:  Here to establish today. Complains of bilateral knee pain and hip pain. Concern that she may have rheumatoid arthritis. Reports her mother suffers from RA. No prior work-up. She is obese. Current body mass index is 37.92 kg/m. Reports pain is present throughout the day. Reports crepitus in his knees when walking. Concern for laxity of her knees as sometime she feels as if she is going fall. She has taken ibuprofen and tylenol for pain with minimal relief.  Negative of fever or rashes.  Current medications: Current Outpatient Medications:  .  albuterol (PROVENTIL HFA;VENTOLIN HFA) 108 (90 Base) MCG/ACT inhaler, Inhale 1-2 puffs into the lungs every 6 (six) hours as needed for wheezing or shortness of breath., Disp: 1 Inhaler, Rfl: 0   Pertinent family medical history: Mother Arthritis   No Known Allergies  Social History   Socioeconomic History  . Marital status: Single    Spouse name: Not on file  . Number of children: Not on file  . Years of education: Not on file  . Highest education level: Not on file  Occupational History  . Not on file  Social Needs  . Financial resource strain: Not on file  . Food insecurity:    Worry: Not on file    Inability: Not on file  . Transportation needs:    Medical: Not on file    Non-medical: Not on file  Tobacco Use  . Smoking status: Never Smoker  . Smokeless tobacco: Never Used  Substance and Sexual Activity  . Alcohol use: No  . Drug use: No  . Sexual activity: Not on file  Lifestyle  . Physical  activity:    Days per week: Not on file    Minutes per session: Not on file  . Stress: Not on file  Relationships  . Social connections:    Talks on phone: Not on file    Gets together: Not on file    Attends religious service: Not on file    Active member of club or organization: Not on file    Attends meetings of clubs or organizations: Not on file    Relationship status: Not on file  . Intimate partner violence:    Fear of current or ex partner: Not on file    Emotionally abused: Not on file    Physically abused: Not on file    Forced sexual activity: Not on file  Other Topics Concern  . Not on file  Social History Narrative  . Not on file    Review of Systems: Constitutional: Negative for fever, chills, diaphoresis, activity change, appetite change and fatigue. Respiratory: Negative for cough, choking, chest tightness, shortness of breath, wheezing and stridor.  Cardiovascular: Negative for chest pain, palpitations and leg swelling. Musculoskeletal: Positive joint swelling, arthralgia and gait problem. Neurological: Negative for dizziness, tremors, seizures, syncope, facial asymmetry, speech difficulty, weakness, light-headedness, numbness and headaches.  Hematological: Negative for adenopathy. Does  not bruise/bleed easily. Psychiatric/Behavioral: Negative for hallucinations, behavioral problems, confusion, dysphoric mood, decreased concentration and agitation.    Objective:   Vitals:   10/03/18 1542  BP: (!) 145/95  Pulse: 87  Resp: 17  SpO2: 99%    BP Readings from Last 3 Encounters:  10/03/18 (!) 145/95  01/21/18 138/72  12/31/17 115/62    Filed Weights   10/03/18 1542  Weight: 249 lb 6.4 oz (113.1 kg)      Physical Exam: Constitutional: Patient appears well-developed and well-nourished. No distress. HENT: Normocephalic, atraumatic, External right and left ear normal. Oropharynx is clear and moist.  Eyes: Conjunctivae and EOM are normal. PERRLA, no  scleral icterus. Neck: Normal ROM. Neck supple. No JVD. No tracheal deviation. No thyromegaly. CVS: RRR, S1/S2 +, no murmurs, no gallops, no carotid bruit.  Pulmonary: Effort and breath sounds normal, no stridor, rhonchi, wheezes, rales.  Abdominal: Soft. BS +, no distension, tenderness, rebound or guarding.  Musculoskeletal:  Unable to elicit patellar reflexes. Generalized tenderness with palpation of knees. No edema or erythema or increased warmth of skin noted.  Neuro: Alert. Normal muscle tone coordination. Antalgic gait.  Skin: Skin is warm and dry. No rash noted. Not diaphoretic. No erythema. No pallor. Psychiatric: Normal mood and affect. Behavior, judgment, thought content normal.     Assessment and plan:  1. Encounter to establish care 2. Chronic pain of right knee 3. Chronic pain of left knee 4. Pain of both hip joints  Will check a rheumatoid panel today given patient complaint of generalized daily joint pain. Will add a Sedimentation Rate.  Ordering imaging. Once labs and imaging are received, will either extend steroid treatment or start antiinflammatory along with cyclobenzaprine for pain until patient is seen by orthopedics.  Dg Knee Complete 4 Views Left  Result Date: 10/05/2018 CLINICAL DATA:  Chronic left knee pain. EXAM: LEFT KNEE - COMPLETE 4+ VIEW COMPARISON:  None. FINDINGS: No acute fracture or dislocation. No joint effusion. Tiny marginal tricompartmental osteophytes. Joint spaces are preserved. Bone mineralization is normal. Soft tissues are unremarkable. IMPRESSION: 1. Minimal degenerative changes. Electronically Signed   By: Obie Dredge M.D.   On: 10/05/2018 16:13   Dg Knee Complete 4 Views Right  Result Date: 10/05/2018 CLINICAL DATA:  Bilateral knee pain. EXAM: RIGHT KNEE - COMPLETE 4+ VIEW COMPARISON:  07/14/2015. FINDINGS: No acute bony or joint abnormality identified. No evidence of fracture dislocation. Mild tricompartment degenerative change.  IMPRESSION: Mild tricompartment degenerative change.  No acute abnormality. Electronically Signed   By: Maisie Fus  Register   On: 10/05/2018 16:14      A total of 30 minutes spent, greater than 50 % of this time was spent counseling and coordination of care.     Return in about 8 weeks (around 11/28/2018), or CPE.   The patient was given clear instructions to go to ER or return to medical center if symptoms don't improve, worsen or new problems develop. The patient verbalized understanding. The patient was advised  to call and obtain lab results if they haven't heard anything from out office within 7-10 business days.  Joaquin Courts, FNP [facility] [address], [place] [address] [phone]: [phone]    This note has been created with Dragon speech recognition software and Paediatric nurse. Any transcriptional errors are unintentional.

## 2018-10-04 LAB — SEDIMENTATION RATE: Sed Rate: 94 mm/hr — ABNORMAL HIGH (ref 0–32)

## 2018-10-04 LAB — RA QN+CCP(IGG/A)+SJOSSA+SJOSSB
CYCLIC CITRULLIN PEPTIDE AB: 8 U (ref 0–19)
ENA SSB (LA) Ab: <0.2 AI (ref 0.0–0.9)
Rhuematoid fact SerPl-aCnc: <10 IU/mL (ref 0.0–13.9)

## 2018-10-05 ENCOUNTER — Ambulatory Visit (HOSPITAL_COMMUNITY)
Admission: RE | Admit: 2018-10-05 | Discharge: 2018-10-05 | Disposition: A | Discharge status: Home / Self Care | Payer: Medicaid Other | Source: Ambulatory Visit | Attending: Family Medicine | Admitting: Family Medicine

## 2018-10-05 DIAGNOSIS — G8929 Other chronic pain: Secondary | ICD-10-CM | POA: Insufficient documentation

## 2018-10-05 DIAGNOSIS — M25561 Pain in right knee: Secondary | ICD-10-CM | POA: Insufficient documentation

## 2018-10-05 DIAGNOSIS — M25562 Pain in left knee: Secondary | ICD-10-CM | POA: Diagnosis not present

## 2018-10-05 DIAGNOSIS — M25552 Pain in left hip: Secondary | ICD-10-CM

## 2018-10-05 DIAGNOSIS — M170 Bilateral primary osteoarthritis of knee: Secondary | ICD-10-CM | POA: Diagnosis not present

## 2018-10-05 DIAGNOSIS — M25551 Pain in right hip: Secondary | ICD-10-CM | POA: Insufficient documentation

## 2018-10-05 MED ORDER — MELOXICAM 15 MG PO TABS
15.0000 mg | ORAL_TABLET | Freq: Every day | ORAL | 1 refills | Status: DC
Start: 2018-10-05 — End: 2018-12-29

## 2018-10-05 MED ORDER — CYCLOBENZAPRINE HCL 5 MG PO TABS: 5.0000 mg | ORAL_TABLET | Freq: Three times a day (TID) | ORAL | 1 refills | Status: DC | PRN

## 2018-10-10 ENCOUNTER — Encounter (INDEPENDENT_AMBULATORY_CARE_PROVIDER_SITE_OTHER): Payer: Self-pay | Admitting: Orthopaedic Surgery

## 2018-10-10 ENCOUNTER — Telehealth: Payer: Self-pay | Admitting: Family Medicine

## 2018-10-10 ENCOUNTER — Ambulatory Visit (INDEPENDENT_AMBULATORY_CARE_PROVIDER_SITE_OTHER): Payer: Medicaid Other | Admitting: Orthopaedic Surgery

## 2018-10-10 VITALS — Ht 64.0 in | Wt 249.0 lb

## 2018-10-10 DIAGNOSIS — M25561 Pain in right knee: Secondary | ICD-10-CM

## 2018-10-10 DIAGNOSIS — M25562 Pain in left knee: Secondary | ICD-10-CM | POA: Diagnosis not present

## 2018-10-10 DIAGNOSIS — E66813 Obesity, class 3: Secondary | ICD-10-CM | POA: Insufficient documentation

## 2018-10-10 DIAGNOSIS — G8929 Other chronic pain: Secondary | ICD-10-CM

## 2018-10-10 DIAGNOSIS — Z6841 Body Mass Index (BMI) 40.0 and over, adult: Secondary | ICD-10-CM | POA: Diagnosis not present

## 2018-10-10 NOTE — Progress Notes (Signed)
Office Visit Note   Patient: Kim Cross           Date of Birth: 12/28/1980           MRN: [id number] Visit Date: 10/10/2018              Requested by: Bing NeighborsHarris, Kimberly S, FNP [address] [place], [place] [id number] PCP: Bing NeighborsHarris, Kimberly S, FNP   Assessment & Plan: Visit Diagnoses:  1. Chronic pain of both knees   2. Class 3 severe obesity due to excess calories without serious comorbidity with body mass index (BMI) of 40.0 to 44.9 in adult [facility])     Plan: Long discussion over 45 minutes predominately in counseling regarding bilateral knee osteoarthritis.  Have suggested weight loss and exercises.  We will send to physical therapy.  Also discussed different treatment options including cortisone.  Follow-Up Instructions: Return if symptoms worsen or fail to improve.   Orders:  No orders of the defined types were placed in this encounter.  No orders of the defined types were placed in this encounter.     Procedures: No procedures performed   Clinical Data: No additional findings.   Subjective: Chief Complaint  Patient presents with  . New Patient (Initial Visit)    BI LAT KNEE PAIN FOR 2 YRS NO INJURY, NO PRIOR INJECTIONS OR SURGERY. R KNEE IS WORSE AND PAIN RADIATES UP TO R HIP, UNABLE TO SLEEP ON R HIP. BI LAT FEET PAIN AND NUMBNESS.   37 year old female visited the office for evaluation of bilateral knee pain.  Mrs. Kim Cross relates that she is had some discomfort was a "child".  She denies any history of injury or trauma.  She has difficulty with deep knee bends and stairs.  Pain is predominant along the anterior aspect of her knees and also some medially.  She was recently evaluated by her family physician with x-rays.  These were reviewed.  There is evidence of osteophyte formation along the medial compartments.  The joint spaces are well-maintained.  No ectopic calcification.  She also has had some recent lab work per her primary care physician and is awaiting  the results. She has tried over-the-counter medicines with some relief.  HPI  Review of Systems  Constitutional: Negative for fatigue and fever.  HENT: Negative for ear pain.   Eyes: Negative for pain.  Respiratory: Negative for cough and shortness of breath.   Cardiovascular: Positive for leg swelling.  Gastrointestinal: Negative for constipation and diarrhea.  Genitourinary: Negative for difficulty urinating.  Musculoskeletal: Positive for back pain and neck pain.  Skin: Positive for rash.  Allergic/Immunologic: Positive for food allergies.  Neurological: Positive for weakness. Negative for numbness.  Hematological: Bruises/bleeds easily.  Psychiatric/Behavioral: Positive for sleep disturbance.     Objective: Vital Signs: Ht 5\' 4"  (1.626 m)   Wt 249 lb (112.9 kg)   BMI 42.74 kg/m   Physical Exam  Constitutional: She is oriented to person, place, and time. She appears well-developed and well-nourished.  HENT:  Mouth/Throat: Oropharynx is clear and moist.  Eyes: Pupils are equal, round, and reactive to light. EOM are normal.  Pulmonary/Chest: Effort normal.  Neurological: She is alert and oriented to person, place, and time.  Skin: Skin is warm and dry.  Psychiatric: She has a normal mood and affect. Her behavior is normal.    Ortho Exam awake alert and oriented x3 comfortable sitting.  BMI is 42.  She is trying to lose some weight and exercise.  Large knees.  Considerable patellar crepitation and pain with patella compression.  Minimal medial joint pain.  Full extension flexion at least 100 degrees without instability.  No popliteal pain.  Straight leg raise negative.  No pain with range of motion of either hip.  Neurologically intact Specialty Comments:  No specialty comments available.  Imaging: No results found.   PMFS History: Patient Active Problem List   Diagnosis Date Noted  . Class 3 severe obesity due to excess calories without serious comorbidity with body  mass index (BMI) of 40.0 to 44.9 in adult (HCC) 10/10/2018  . Chronic pain of both knees 10/05/2018  . Pain of both hip joints 10/05/2018   History reviewed. No pertinent past medical history.  History reviewed. No pertinent family history.  Past Surgical History:  Procedure Laterality Date  . CHOLECYSTECTOMY     Social History   Occupational History  . Not on file  Tobacco Use  . Smoking status: Never Smoker  . Smokeless tobacco: Never Used  Substance and Sexual Activity  . Alcohol use: Yes    Comment: OCC  . Drug use: No  . Sexual activity: Not on file      ,7Y,,

## 2018-10-10 NOTE — Telephone Encounter (Signed)
Patient called requesting lab results, please follow up °

## 2018-10-11 ENCOUNTER — Other Ambulatory Visit (INDEPENDENT_AMBULATORY_CARE_PROVIDER_SITE_OTHER): Payer: Self-pay | Admitting: Radiology

## 2018-10-11 ENCOUNTER — Telehealth (INDEPENDENT_AMBULATORY_CARE_PROVIDER_SITE_OTHER): Payer: Self-pay | Admitting: Orthopaedic Surgery

## 2018-10-11 DIAGNOSIS — G8929 Other chronic pain: Secondary | ICD-10-CM

## 2018-10-11 DIAGNOSIS — M25561 Pain in right knee: Secondary | ICD-10-CM

## 2018-10-11 DIAGNOSIS — M25562 Pain in left knee: Principal | ICD-10-CM

## 2018-10-11 NOTE — Telephone Encounter (Signed)
Will send in new referral

## 2018-10-11 NOTE — Telephone Encounter (Signed)
Kim Cross and [facility] called to thank you for the referral, but they do not contact with adult Medicaid. Patient will need to be seen somewhere else.

## 2018-10-24 ENCOUNTER — Ambulatory Visit: Payer: Medicaid Other | Admitting: Physical Therapy

## 2018-10-26 ENCOUNTER — Ambulatory Visit: Payer: Medicaid Other | Attending: Orthopaedic Surgery | Admitting: Physical Therapy

## 2018-10-26 ENCOUNTER — Other Ambulatory Visit: Payer: Self-pay

## 2018-10-26 ENCOUNTER — Encounter: Payer: Self-pay | Admitting: Physical Therapy

## 2018-10-26 DIAGNOSIS — M25662 Stiffness of left knee, not elsewhere classified: Secondary | ICD-10-CM | POA: Insufficient documentation

## 2018-10-26 DIAGNOSIS — M25561 Pain in right knee: Secondary | ICD-10-CM | POA: Insufficient documentation

## 2018-10-26 DIAGNOSIS — G8929 Other chronic pain: Secondary | ICD-10-CM | POA: Diagnosis present

## 2018-10-26 DIAGNOSIS — M25562 Pain in left knee: Secondary | ICD-10-CM | POA: Diagnosis present

## 2018-10-26 DIAGNOSIS — M25661 Stiffness of right knee, not elsewhere classified: Secondary | ICD-10-CM | POA: Diagnosis present

## 2018-10-26 DIAGNOSIS — R2689 Other abnormalities of gait and mobility: Secondary | ICD-10-CM | POA: Diagnosis present

## 2018-10-26 NOTE — Therapy (Signed)
[facility] [address] [place], [place], [address] Phone: [phone]   Fax:  [phone]  Physical Therapy Evaluation  Patient Details  Name: Kim Cross MRN: [id number] Date of Birth: [birth year] Referring Provider (PT): Valeria Batman   Encounter Date: 10/26/2018  PT End of Session - 10/26/18 1604    Visit Number  1    Number of Visits  11    Date for PT Re-Evaluation  01/04/19    Authorization Type  Medicaid-auth required    PT Start Time  1515    PT Stop Time  1558    PT Time Calculation (min)  43 min    Activity Tolerance  Patient tolerated treatment well    Behavior During Therapy  [facility] for tasks assessed/performed       History reviewed. No pertinent past medical history.  Past Surgical History:  Procedure Laterality Date  . CHOLECYSTECTOMY      There were no vitals filed for this visit.   Subjective Assessment - 10/26/18 1518    Subjective  Pt is a 37 y/o female who presents to OPPT for bil knee pain with Rt worse than Lt.  C/o difficulty with climbing stairs and shooting pain up into hip.  Pt states pain is chronic x ~15 years, worsening symptoms x 2 years.      Limitations  Walking    How long can you sit comfortably?  pain with transition to standing    How long can you walk comfortably?  "bothers me at times"    Diagnostic tests  xrays: OA bil knees    Patient Stated Goals  exercises to do to avoid surgery    Currently in Pain?  Yes    Pain Score  8    up to 9/10; at best 8/10   Pain Orientation  Right;Left;Anterior;Posterior   Rt>Lt   Pain Descriptors / Indicators  Cramping;Spasm;Shooting;Sharp;Dull   "severe stiffness"   Pain Type  Chronic pain    Pain Onset  More than a month ago    Pain Frequency  Constant    Aggravating Factors   stairs, walking, sit to stand, difficulty sleeping on Rt side    Pain Relieving Factors  ice, medication, elevation         [facility] PT Assessment  - 10/26/18 1523      Assessment   Medical Diagnosis  M25.561,G89.29 (ICD-10-CM) - Chronic pain of right knee; M25.562,G89.29 (ICD-10-CM) - Chronic pain of left knee    Referring Provider (PT)  Cleophas Dunker, PETER W    Onset Date/Surgical Date  --   chronic x 15 years (worse x 2 years)   Next MD Visit  PRN    Prior Therapy  none      Precautions   Precautions  None      Restrictions   Weight Bearing Restrictions  No      Balance Screen   Has the patient fallen in the past 6 months  No    Has the patient had a decrease in activity level because of a fear of falling?   Yes    Is the patient reluctant to leave their home because of a fear of falling?   No      Home Environment   Living Environment  Private residence    Living Arrangements  Children   60 y/o son   Type of Home  House    Home Access  Stairs to enter  Entrance Stairs-Number of Steps  4    Entrance Stairs-Rails  Right;Left;Cannot reach both    Home Layout  One level      Prior Function   Level of Independence  Independent    Vocation  Unemployed   was working at [employer] but unable to continue   Leisure  bike riding, go to park, movies; regular exercise: none      Cognition   Overall Cognitive Status  Within Functional Limits for tasks assessed      Posture/Postural Control   Posture/Postural Control  Postural limitations    Postural Limitations  Rounded Shoulders;Forward head      ROM / Strength   AROM / PROM / Strength  AROM;Strength      AROM   Overall AROM Comments  measured in sitting    AROM Assessment Site  Knee    Right/Left Knee  Right;Left    Right Knee Extension  -8    Right Knee Flexion  87    Left Knee Extension  0    Left Knee Flexion  90      Strength   Overall Strength Comments  submaximal effort noted with knee testing    Strength Assessment Site  Hip;Knee;Ankle    Right/Left Hip  Right;Left    Right Hip Flexion  4/5    Right Hip Extension  3/5    Right Hip ABduction  4/5    Left Hip  Flexion  4/5    Left Hip Extension  3/5    Left Hip ABduction  4/5    Right/Left Knee  Right;Left    Right Knee Flexion  3+/5    Right Knee Extension  3+/5    Left Knee Flexion  4/5    Left Knee Extension  4/5    Right/Left Ankle  Right;Left    Right Ankle Dorsiflexion  4/5    Left Ankle Dorsiflexion  5/5      Flexibility   Soft Tissue Assessment /Muscle Length  yes   tightness in gastrocs   Hamstrings  tightness bil      Palpation   Patella mobility  lateral tracking bil    Palpation comment  mild pain medial joint line bil      Transfers   Comments  increased time and pain noted with sit to stand      Ambulation/Gait   Gait Pattern  Antalgic                Objective measurements completed on examination: See above findings.      [facility] Adult PT Treatment/Exercise - 10/26/18 1523      Self-Care   Self-Care  Other Self-Care Comments    Other Self-Care Comments   benefits and concerns about using knee brace; educated on TENS unit and how to purchase; recommend aquatic exercise and pt open to persuing      Exercises   Exercises  Knee/Hip      Knee/Hip Exercises: Stretches   Passive Hamstring Stretch  Both;2 reps;30 seconds    Passive Hamstring Stretch Limitations  seated    Gastroc Stretch  Both;2 reps;30 seconds      Knee/Hip Exercises: Seated   Long Arc Quad  5 reps;Both      Knee/Hip Exercises: Supine   Bridges  5 reps    Straight Leg Raise with External Rotation  Both;5 reps    Straight Leg Raise with External Rotation Limitations  increased difficulty with Rt  PT Education - 10/26/18 1604    Education Details  HEP, see self care    Person(s) Educated  Patient    Methods  Explanation;Handout;Demonstration    Comprehension  Verbalized understanding;Returned demonstration       PT Short Term Goals - 10/26/18 1609      PT SHORT TERM GOAL #1   Title  independent with initial HEP    Baseline  no HEP    Status  New    Target  Date  11/21/18      PT SHORT TERM GOAL #2   Title  report pain < 7/10 with ADLs for improved function    Baseline  pain 8/10 at best, 9/10 with activity    Status  New    Target Date  11/21/18      PT SHORT TERM GOAL #3   Title  bil knee flexion improved to at least 105 degrees for improved function and mobility    Baseline  Rt: 87 degrees; Lt: 90 degrees    Status  New    Target Date  11/21/18        PT Long Term Goals - 10/26/18 1612      PT LONG TERM GOAL #1   Title  independent with advanced HEP    Baseline  no HEP    Status  New    Target Date  12/21/18      PT LONG TERM GOAL #2   Title  negotiate stairs with pain < 6/10 for improved function    Baseline  pain 8-9/10 at rest and with activity    Status  New    Target Date  12/21/18      PT LONG TERM GOAL #3   Title  report 50% improvement in sleep for better productivity and improved function    Baseline  difficulty sleeping at night with increased pain and unable to find comfortable positions    Status  New    Target Date  12/21/18      PT LONG TERM GOAL #4   Title  demonstrate 4+/5 knee strength for improved function    Baseline  Rt knee: 3+/5; Lt knee: 4/5    Status  New    Target Date  12/21/18             Plan - 10/26/18 1600    Clinical Impression Statement  Pt is a 37 y/o female who presents to OPPT for bil knee pain due to OA.  Pt demonstrates decreased strength and ROM as well as gait abnormalities due to pain.  Pt will benefit from PT to address deficits listed.    Clinical Presentation  Stable    Clinical Decision Making  Low    Rehab Potential  Good    PT Frequency  2x / week   1x/wk x 3 wks then 2x/wk x 4 wks   PT Duration  6 weeks    PT Treatment/Interventions  ADLs/Self Care Home Management;Cryotherapy;Electrical Stimulation;Iontophoresis 4mg /ml Dexamethasone;Moist Heat;Ultrasound;Therapeutic exercise;Therapeutic activities;Functional mobility training;Stair training;Gait  training;Neuromuscular re-education;Patient/family education;Manual techniques;Passive range of motion;Dry needling;Taping;Vasopneumatic Device    PT Next Visit Plan  review HEP, give exercises to perform in pool if pt still interested, progress strengthening and flexibility exercises, manual/modalities PRN    PT Home Exercise Plan  Access Code: [email or web address]    Consulted and Agree with Plan of Care  Patient       Patient will benefit from skilled therapeutic intervention in order to improve the  following deficits and impairments:  Abnormal gait, Pain, Increased fascial restricitons, Decreased mobility, Decreased range of motion, Decreased activity tolerance, Decreased endurance, Decreased strength, Impaired flexibility, Difficulty walking  Visit Diagnosis: Chronic pain of right knee - Plan: PT plan of care cert/re-cert  Chronic pain of left knee - Plan: PT plan of care cert/re-cert  Stiffness of right knee, not elsewhere classified - Plan: PT plan of care cert/re-cert  Stiffness of left knee, not elsewhere classified - Plan: PT plan of care cert/re-cert  Other abnormalities of gait and mobility - Plan: PT plan of care cert/re-cert     Problem List Patient Active Problem List   Diagnosis Date Noted  . Class 3 severe obesity due to excess calories without serious comorbidity with body mass index (BMI) of 40.0 to 44.9 in adult (HCC) 10/10/2018  . Chronic pain of both knees 10/05/2018  . Pain of both hip joints 10/05/2018      Clarita CraneStephanie F Medina Degraffenreid, PT, DPT 10/26/18 4:17 PM      [facility] [address] [place], [place], [address] Phone: [phone]   Fax:  [phone]  Name: Kim Cross MRN: [id number] Date of Birth: 07/27/1981

## 2018-10-26 NOTE — Patient Instructions (Signed)
Access Code: [email or web address]  URL: [email or web address]  Date: 10/26/2018  Prepared by: Moshe CiproStephanie Azaliah Carrero   Exercises  Supine Bridge - 10 reps - 1 sets - 5 sec hold - 2x daily - 7x weekly  Straight Leg Raise with External Rotation - 10 reps - 1 sets - 2-3 sec hold - 2x daily - 7x weekly  Seated Long Arc Quad - 10 reps - 1 sets - 2x daily - 7x weekly  Gastroc Stretch on Wall - 3 reps - 1 sets - 30 sec hold - 2x daily - 7x weekly  Seated Hamstring Stretch - 3 reps - 1 sets - 30 sec hold - 2x daily - 7x weekly  Patient Education  TENS Unit

## 2018-10-27 NOTE — Progress Notes (Signed)
Mailed unable to contact letter.

## 2018-11-28 ENCOUNTER — Ambulatory Visit: Payer: Medicaid Other | Admitting: Family Medicine

## 2018-11-30 ENCOUNTER — Encounter (HOSPITAL_COMMUNITY): Payer: Self-pay | Admitting: Emergency Medicine

## 2018-11-30 ENCOUNTER — Ambulatory Visit (INDEPENDENT_AMBULATORY_CARE_PROVIDER_SITE_OTHER): Payer: Medicaid Other

## 2018-11-30 ENCOUNTER — Other Ambulatory Visit: Payer: Self-pay

## 2018-11-30 ENCOUNTER — Ambulatory Visit (HOSPITAL_COMMUNITY)
Admission: EM | Admit: 2018-11-30 | Discharge: 2018-11-30 | Disposition: A | Discharge status: Home / Self Care | Payer: Medicaid Other | Attending: Family Medicine | Admitting: Family Medicine

## 2018-11-30 DIAGNOSIS — R0789 Other chest pain: Secondary | ICD-10-CM | POA: Diagnosis not present

## 2018-11-30 NOTE — ED Triage Notes (Signed)
PT reports intermittent stabbing left chest pain for 2 weeks. SOB occurs with chest pain. No pain radiation.     Headaches as well.

## 2018-11-30 NOTE — Discharge Instructions (Addendum)
Your EKG and chest x-ray were normal I believe that your pain is likely either related to a deep muscle pain or stress. You can try taking some ibuprofen, 600 mg every 6 hours for pain inflammation If you are having some anxiety or high stressful situation you could try taking a Benadryl as this has been proven to likely relax you.  It will also help you rest. Follow-up with your primary care for continued or worsening symptoms For worsening chest pain, shortness of breath or palpitations please go the ER

## 2018-11-30 NOTE — ED Provider Notes (Signed)
[facility]    CSN: [id number] Arrival date & time: 11/30/18  1614     History   Chief Complaint Chief Complaint  Patient presents with  . Chest Pain  . Headache    HPI Kim Cross is a 38 y.o. female.   Patient is a 38 year old female with no significant past medical history.  She presents today with sharp stabbing left-sided chest pain that is been intermittent over the past 2 weeks.  She denies anything exacerbating the symptoms.  She has not done anything to treat the symptoms.  Reports sometimes she feels like it is hard to catch her breath.  Denies any cough, congestion, fevers.  Denies any palpitations.  Denies any recent traveling long distances, hormone use or smoking.  She does not feel as if her heart is been racing.  She admits to being under a lot of stress at this time taking care of her mother.  She is wheelchair-bound and requires a lot of lifting and moving parts.  ROS per HPI      History reviewed. No pertinent past medical history.  Patient Active Problem List   Diagnosis Date Noted  . Class 3 severe obesity due to excess calories without serious comorbidity with body mass index (BMI) of 40.0 to 44.9 in adult (HCC) 10/10/2018  . Chronic pain of both knees 10/05/2018  . Pain of both hip joints 10/05/2018    Past Surgical History:  Procedure Laterality Date  . CHOLECYSTECTOMY      OB History   No obstetric history on file.      Home Medications    Prior to Admission medications   Medication Sig Start Date End Date Taking? Authorizing Provider  albuterol (PROVENTIL HFA;VENTOLIN HFA) 108 (90 Base) MCG/ACT inhaler Inhale 1-2 puffs into the lungs every 6 (six) hours as needed for wheezing or shortness of breath. 12/26/17   Cathie Hoops, Amy V, PA-C  cyclobenzaprine (FLEXERIL) 5 MG tablet Take 1 tablet (5 mg total) by mouth 3 (three) times daily as needed for muscle spasms. 10/05/18   Bing Neighbors, FNP  meloxicam (MOBIC) 15 MG tablet Take 1  tablet (15 mg total) by mouth daily. 10/05/18   Bing Neighbors, FNP    Family History History reviewed. No pertinent family history.  Social History Social History   Tobacco Use  . Smoking status: Never Smoker  . Smokeless tobacco: Never Used  Substance Use Topics  . Alcohol use: Yes    Comment: OCC  . Drug use: No     Allergies   Patient has no known allergies.   Review of Systems Review of Systems   Physical Exam Triage Vital Signs ED Triage Vitals  Enc Vitals Group     BP 11/30/18 1643 129/81     Pulse Rate 11/30/18 1643 83     Resp 11/30/18 1643 16     Temp 11/30/18 1643 98.3 F (36.8 C)     Temp Source 11/30/18 1643 Oral     SpO2 11/30/18 1643 99 %     Weight --      Height --      Head Circumference --      Peak Flow --      Pain Score 11/30/18 1642 8     Pain Loc --      Pain Edu? --      Excl. in GC? --    No data found.  Updated Vital Signs BP 129/81   Pulse 83  Temp 98.3 F (36.8 C) (Oral)   Resp 16   LMP 11/28/2018 (Exact Date)   SpO2 99%   Visual Acuity Right Eye Distance:   Left Eye Distance:   Bilateral Distance:    Right Eye Near:   Left Eye Near:    Bilateral Near:     Physical Exam Vitals signs and nursing note reviewed.  Constitutional:      General: She is not in acute distress.    Appearance: She is well-developed. She is not ill-appearing, toxic-appearing or diaphoretic.  HENT:     Head: Normocephalic and atraumatic.  Neck:     Musculoskeletal: Normal range of motion.  Cardiovascular:     Rate and Rhythm: Normal rate and regular rhythm.     Heart sounds: Normal heart sounds.  Pulmonary:     Effort: Pulmonary effort is normal.     Breath sounds: Normal breath sounds.  Chest:     Chest wall: No mass, deformity, tenderness, crepitus or edema.  Musculoskeletal: Normal range of motion.     Right lower leg: She exhibits no tenderness. No edema.     Left lower leg: She exhibits no tenderness. No edema.    Lymphadenopathy:     Cervical: No cervical adenopathy.  Skin:    General: Skin is warm and dry.  Neurological:     Mental Status: She is alert.  Psychiatric:        Mood and Affect: Mood normal.      UC Treatments / Results  Labs (all labs ordered are listed, but only abnormal results are displayed) Labs Reviewed - No data to display  EKG None  Radiology Dg Chest 2 View  Result Date: 11/30/2018 CLINICAL DATA:  Left-sided chest pain and shortness of breath for 2 weeks EXAM: CHEST - 2 VIEW COMPARISON:  None. FINDINGS: The heart size and mediastinal contours are within normal limits. Both lungs are clear. The visualized skeletal structures are unremarkable. IMPRESSION: No active cardiopulmonary disease. Electronically Signed   By: Alcide CleverMark  Lukens M.D.   On: 11/30/2018 17:32    Procedures Procedures (including critical care time)  Medications Ordered in UC Medications - No data to display  Initial Impression / Assessment and Plan / UC Course  I have reviewed the triage vital signs and the nursing notes.  Pertinent labs & imaging results that were available during my care of the patient were reviewed by me and considered in my medical decision making (see chart for details).     Patient is a 38 year old female presents with intermittent left sharp stabbing chest pain that is been ongoing for 2 weeks. Her x-ray and EKG were completely normal. Exam normal VSS, non toxic or ill appearing.  No concern for PE, ACS, CHF or pneumonia.  Most likely her pain is musculoskeletal or stress related. She can try 600 mg of ibuprofen every 6 hours to see if this helps Spoke about ways to reduce stress and help relax. If her symptoms worsen to include more severe chest pain, shortness of breath or palpitation she needs to go the ER. Patient understanding agreeable to plan. Final Clinical Impressions(s) / UC Diagnoses   Final diagnoses:  Atypical chest pain     Discharge Instructions      Your EKG and chest x-ray were normal I believe that your pain is likely either related to a deep muscle pain or stress. You can try taking some ibuprofen, 600 mg every 6 hours for pain inflammation If you are having  some anxiety or high stressful situation you could try taking a Benadryl as this has been proven to likely relax you.  It will also help you rest. Follow-up with your primary care for continued or worsening symptoms For worsening chest pain, shortness of breath or palpitations please go the ER    ED Prescriptions    None     Controlled Substance Prescriptions [place] Controlled Substance Registry consulted? Not Applicable   Janace Aris, NP 11/30/18 1846

## 2018-12-27 ENCOUNTER — Telehealth: Payer: Self-pay | Admitting: Family Medicine

## 2018-12-27 NOTE — Telephone Encounter (Signed)
Patient called stating that she would like a letter stating that she has arthritis to give to her apartment because she is trying to break her lease early, patient stated that the steps are very painful for her.

## 2018-12-28 NOTE — Telephone Encounter (Signed)
Letter is completed. Patient can pick up 12/29/2018

## 2018-12-28 NOTE — Telephone Encounter (Signed)
Yes I can complete letter. It may take up to 5 business days to complete. We will call her once letter is ready for pick-up.

## 2018-12-28 NOTE — Telephone Encounter (Signed)
Called patient informing her that provider will be writing a letter for her,and that it should be ready in 5 business days but that we will call when its ready, LVM.

## 2018-12-29 ENCOUNTER — Telehealth: Payer: Self-pay | Admitting: Family Medicine

## 2018-12-29 MED ORDER — CYCLOBENZAPRINE HCL 5 MG PO TABS: 5.0000 mg | ORAL_TABLET | Freq: Three times a day (TID) | ORAL | 1 refills | Status: DC | PRN

## 2018-12-29 MED ORDER — MELOXICAM 15 MG PO TABS: 15.0000 mg | ORAL_TABLET | Freq: Every day | ORAL | 1 refills | Status: DC

## 2018-12-29 NOTE — Telephone Encounter (Signed)
cyclobenzaprine (FLEXERIL) 5 MG tablet [[id number]]  meloxicam (MOBIC) 15 MG tablet [[id number]]    Patient would like refills and to be prescribed a higher dosage. Please follow up.

## 2018-12-29 NOTE — Telephone Encounter (Signed)
Contacted patient to pick up letter, stated she would pick it up this morning.

## 2018-12-29 NOTE — Telephone Encounter (Signed)
I have refilled medication as prescribed. She will need to schedule a follow-up if she requires medication dose adjustments.

## 2018-12-29 NOTE — Telephone Encounter (Signed)
LVM

## 2019-02-07 ENCOUNTER — Telehealth: Payer: Self-pay | Admitting: Family Medicine

## 2019-02-07 NOTE — Telephone Encounter (Signed)
Patient called because she would like to get tested for COVID-19. Patient only has cough, shortness of breath, and on and off fevers. Patient stated she has not traveled nor been in contact with anyone who has tested positive for it.

## 2019-02-07 NOTE — Telephone Encounter (Signed)
Patient does not meet the complete criteria for COVID-19 testing.  Patient should have an office visit for cough and respiratory concerns.

## 2019-02-07 NOTE — Telephone Encounter (Signed)
Nurse called the patient's home phone number but received no answer and message was left on the voicemail for the patient to call back.  Return phone number given. 

## 2019-03-28 ENCOUNTER — Telehealth: Payer: Self-pay | Admitting: Family Medicine

## 2019-03-28 NOTE — Telephone Encounter (Signed)
Patient has not been seen by me since 09/2018. She will need to schedule a telehealth visit to have request evaluated.

## 2019-03-28 NOTE — Telephone Encounter (Signed)
Patient would like a letter stating she can not due jury duty because of medical reasons.

## 2019-04-02 ENCOUNTER — Other Ambulatory Visit: Payer: Self-pay

## 2019-04-02 ENCOUNTER — Encounter: Payer: Self-pay | Admitting: Family Medicine

## 2019-04-02 ENCOUNTER — Ambulatory Visit (INDEPENDENT_AMBULATORY_CARE_PROVIDER_SITE_OTHER): Payer: Medicaid Other | Admitting: Family Medicine

## 2019-04-02 DIAGNOSIS — Z6841 Body Mass Index (BMI) 40.0 and over, adult: Secondary | ICD-10-CM | POA: Diagnosis not present

## 2019-04-02 DIAGNOSIS — F4323 Adjustment disorder with mixed anxiety and depressed mood: Secondary | ICD-10-CM | POA: Diagnosis not present

## 2019-04-02 DIAGNOSIS — M7918 Myalgia, other site: Secondary | ICD-10-CM | POA: Diagnosis not present

## 2019-04-02 DIAGNOSIS — G8929 Other chronic pain: Secondary | ICD-10-CM

## 2019-04-02 MED ORDER — MELOXICAM 15 MG PO TABS: 15.0000 mg | ORAL_TABLET | Freq: Every day | ORAL | 3 refills | Status: AC

## 2019-04-02 MED ORDER — ALBUTEROL SULFATE HFA 108 (90 BASE) MCG/ACT IN AERS: 1.0000 | INHALATION_SPRAY | Freq: Four times a day (QID) | RESPIRATORY_TRACT | 2 refills | Status: AC | PRN

## 2019-04-02 MED ORDER — CYCLOBENZAPRINE HCL 5 MG PO TABS: 5.0000 mg | ORAL_TABLET | Freq: Three times a day (TID) | ORAL | 3 refills | Status: AC | PRN

## 2019-04-02 MED ORDER — BUSPIRONE HCL 10 MG PO TABS: 10.0000 mg | ORAL_TABLET | Freq: Two times a day (BID) | ORAL | 2 refills | Status: AC

## 2019-04-02 MED ORDER — TRAZODONE HCL 100 MG PO TABS: 50.0000 mg | ORAL_TABLET | Freq: Every day | ORAL | 2 refills | Status: AC

## 2019-04-02 NOTE — Progress Notes (Deleted)
Called patient to initiate their telephone visit with provider Joaquin Courts, FNP-C. Verified date of birth. Patient has jury duty in June. Would like a letter to excuse her due to her osteoarthritis & chronic pain. Patient also requesting something for depression/anxiety. States that she is currently under a lot of stress(worries about COVID, elderly father is ill & her autistic son). Wants to know if she can be prescribed something to help. KWalker, CMA.

## 2019-04-02 NOTE — Progress Notes (Signed)
Virtual Visit via Telephone Note  I connected with Kim Cross on 04/02/19 at 11:10 AM EDT by telephone and verified that I am speaking with the correct person using two identifiers.  Location: Patient: Located at her parents residence, in [place], [place]. Provider: Located at primary care office    I discussed the limitations, risks, security and privacy concerns of performing an evaluation and management service by telephone and the availability of in person appointments. I also discussed with the patient that there may be a patient responsible charge related to this service. The patient expressed understanding and agreed to proceed.   History of Present Illness:  Chronic musculoskeletal pain  Patient suffers from degenerative joint disease of bilateral knees. She completed PT back in December which improved pain and mobility temporarily, however, symptoms of pain are currently active. She take Meloxicam and flexeril for pain management. She was recently summons to jury duty and is requesting a medical excuse letter to be removed from jury duty.  Depression and Anxiety, New Kim Cross endorses recent stress as the cause of increased depression and anxiety symptoms.  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  She has children which she has to care for and due COVID-19, school closures have resulted in her children being home everyday which is also contributing to stress. Current symptoms include anxiousness, restlessness, and difficulty with sleep. She has no prior diagnosis of major depressive disorder or generalized anxiety disorder. She is never previously been on medication. She denies suicidal ideations, homicidal ideations, or auditory hallucinations.    GAD 7 : Generalized Anxiety Score 04/02/2019  Nervous, Anxious, on Edge 2  Control/stop worrying 2  Worry too much - different things 2  Trouble relaxing 2  Restless 2  Easily annoyed  or irritable 2  Afraid - awful might happen 2  Total GAD 7 Score 14     Depression screen PHQ 2/9 04/02/2019  Decreased Interest 2  Down, Depressed, Hopeless 1  PHQ - 2 Score 3  Altered sleeping 3  Tired, decreased energy 2  Change in appetite 2  Feeling bad or failure about yourself  1  Trouble concentrating 1  Moving slowly or fidgety/restless 1  Suicidal thoughts 0  PHQ-9 Score 13  Difficult doing work/chores Somewhat difficult   Assessment and Plan: 1. Class 3 severe obesity due to excess calories without serious comorbidity with body mass index (BMI) of 40.0 to 44.9 in adult [facility](HCC) Encourage routine physical activity to increase weight loss. This will help manage arthritis of the knees.  2. Chronic musculoskeletal pain -Continue Meloxicam and Cyclobenzaprine  -[place] [facility] [place] requesting patient to be excused from jury duty  3. Adjustment disorder with mixed anxiety and depressed mood -Trial trazodone 50-100 mg one hour prior to bedtime for insomnia and depression symptoms. -Trial buspirone 10 mg BID for anxiety  -Patient will schedule an appt with LCSW    Follow Up Instructions: 2 month CPE and follow-up on depression and anxiety.   I discussed the assessment and treatment plan with the patient. The patient was provided an opportunity to ask questions and all were answered. The patient agreed with the plan and demonstrated an understanding of the instructions.   The patient was advised to call back or seek an in-person evaluation if the symptoms worsen or if the condition fails to improve as anticipated.  I provided 25 minutes of non-face-to-face time during this encounter.   Cala BradfordKimberly  Tiburcio Pea, FNP-C

## 2019-04-17 ENCOUNTER — Telehealth: Payer: Self-pay | Admitting: Family Medicine

## 2019-04-17 NOTE — Telephone Encounter (Signed)
Patient called because she never received the letter for her jury duty, patient asks that it be sent to her through [place]. Please follow up.

## 2019-04-17 NOTE — Telephone Encounter (Signed)
Sent letter from Apr 02, 2019 to patient's MyChart account.

## 2019-06-12 IMAGING — CR DG KNEE COMPLETE 4+V*L*
4 series · 4 of 4 positions shown · non-contrast
Comparison: None.

CLINICAL DATA: Chronic left knee pain.

EXAM:
LEFT KNEE - COMPLETE 4+ VIEW

[t knee ap left]
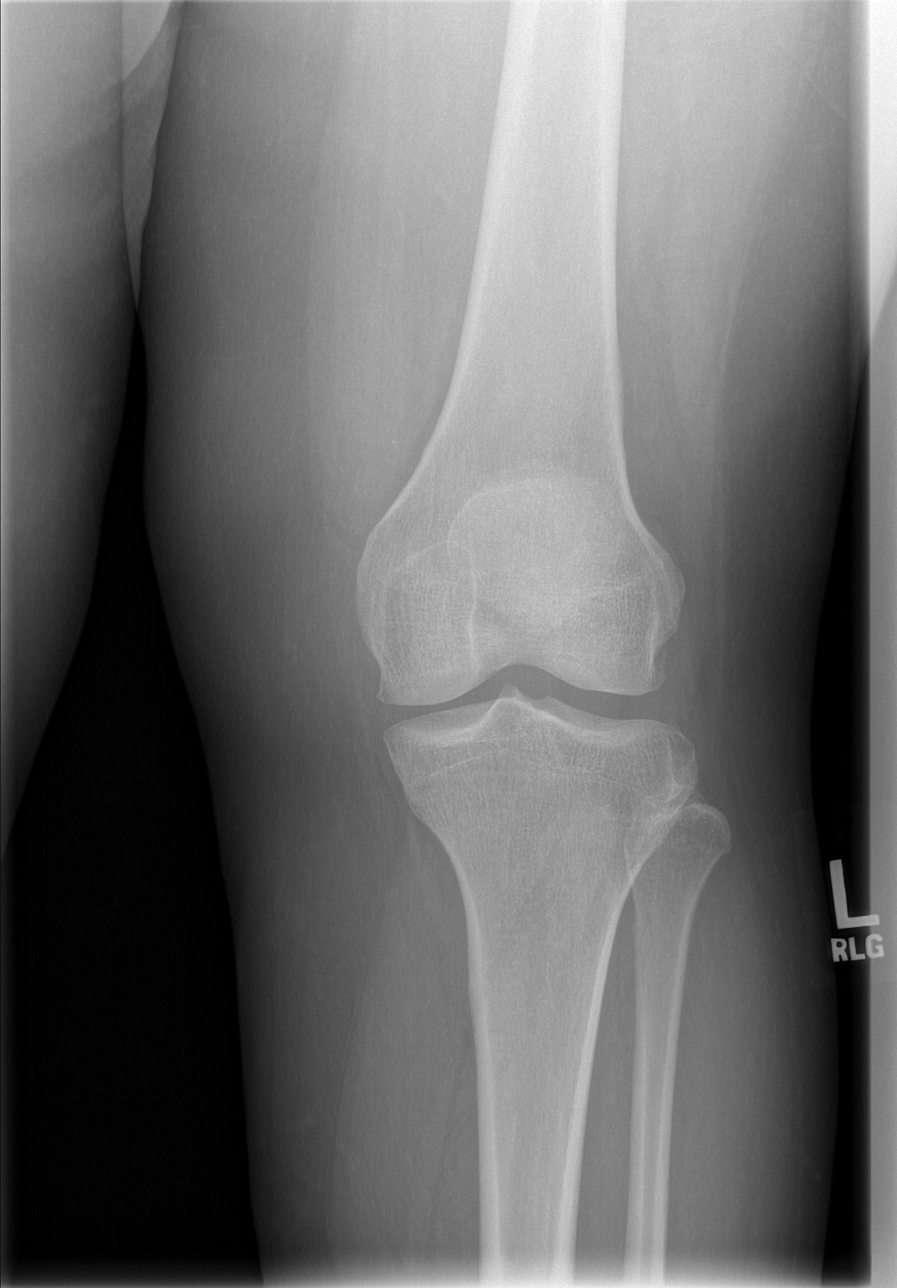

[t knee oblique left (1 of 2)]
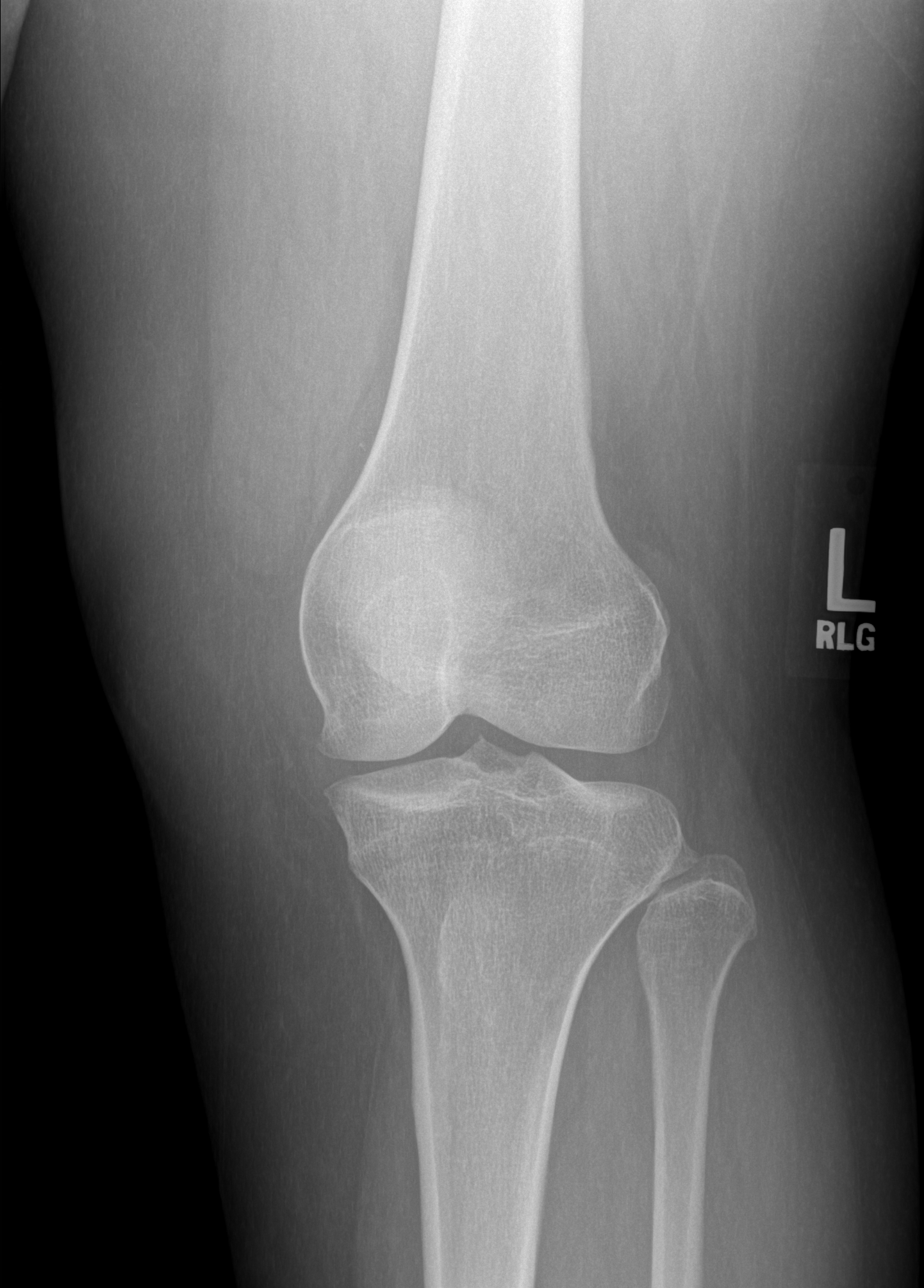

[t knee oblique left (2 of 2)]
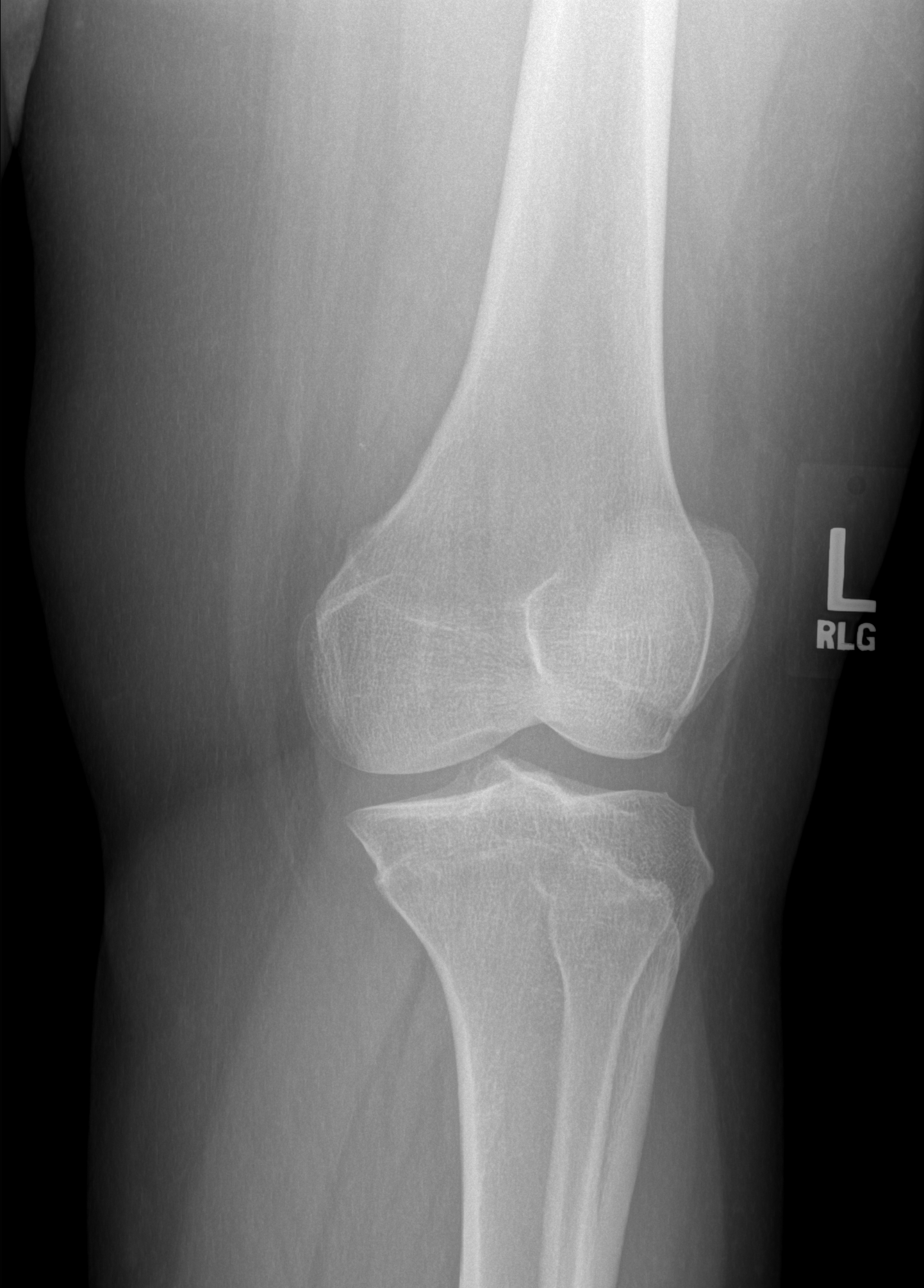

[t knee lat left]
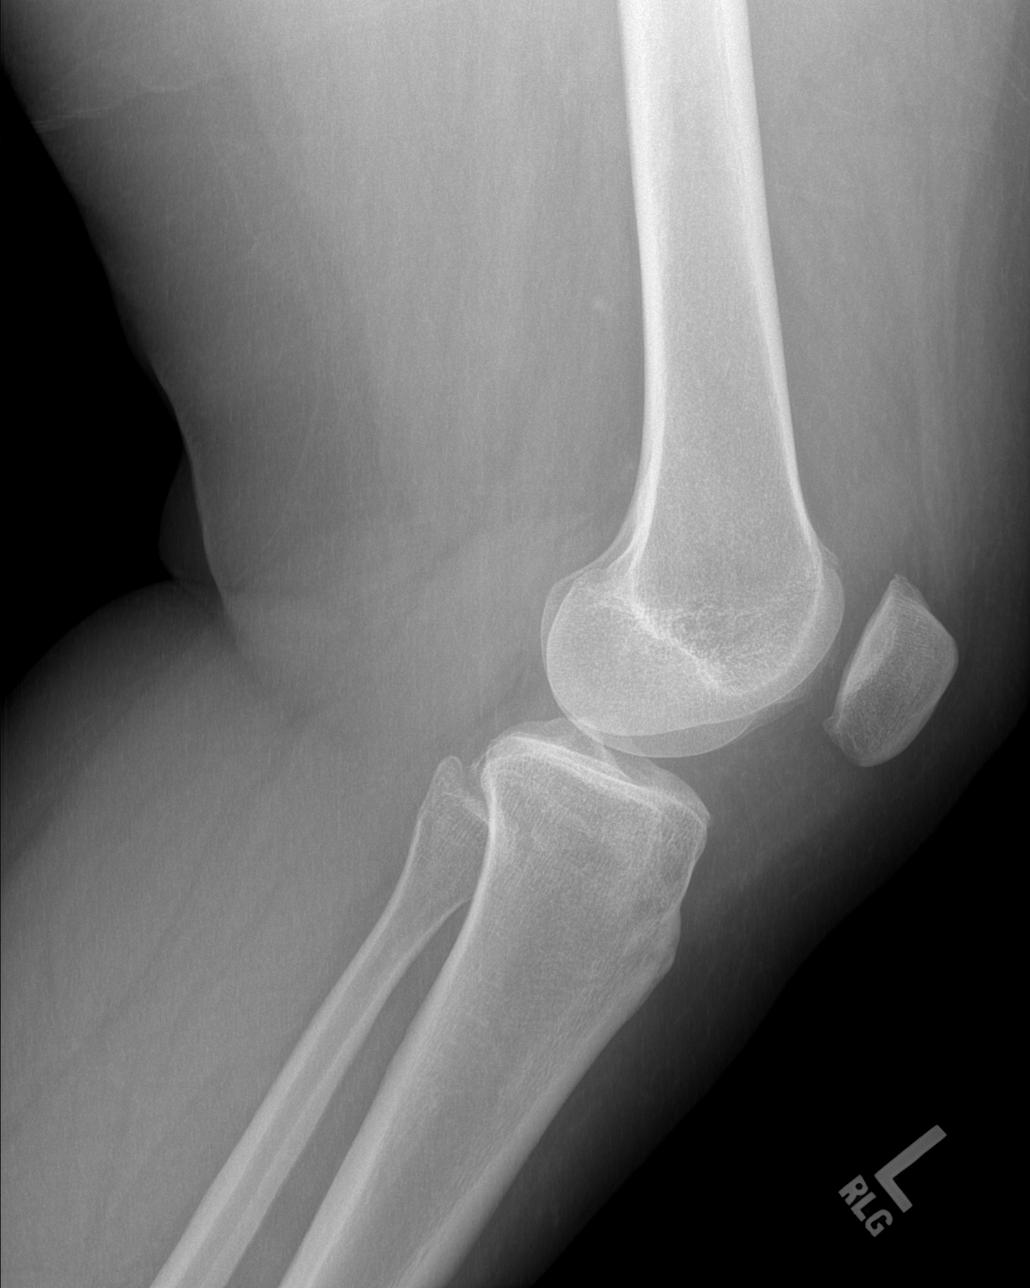

[4 of 4 positions shown; findings below may reference images not displayed]

FINDINGS: No acute fracture or dislocation. No joint effusion. Tiny marginal
tricompartmental osteophytes. Joint spaces are preserved. Bone
mineralization is normal. Soft tissues are unremarkable.
IMPRESSION: 1. Minimal degenerative changes.

## 2019-06-25 ENCOUNTER — Other Ambulatory Visit: Payer: Self-pay | Admitting: Family Medicine

## 2019-07-17 ENCOUNTER — Telehealth: Payer: Self-pay

## 2019-07-17 NOTE — Telephone Encounter (Signed)
Called patient to do their pre-visit COVID screening.  Call went to voicemail. Unable to do prescreening.  

## 2019-07-18 ENCOUNTER — Ambulatory Visit: Payer: Medicaid Other | Admitting: Nurse Practitioner

## 2019-07-23 ENCOUNTER — Other Ambulatory Visit: Payer: Self-pay | Admitting: Family Medicine

## 2019-07-25 ENCOUNTER — Other Ambulatory Visit: Payer: Self-pay | Admitting: Family Medicine

## 2019-08-07 IMAGING — DX DG CHEST 2V
2 series · 2 of 2 positions shown · non-contrast
Comparison: None.

CLINICAL DATA: Left-sided chest pain and shortness of breath for 2
weeks

EXAM:
CHEST - 2 VIEW

[chest pa]
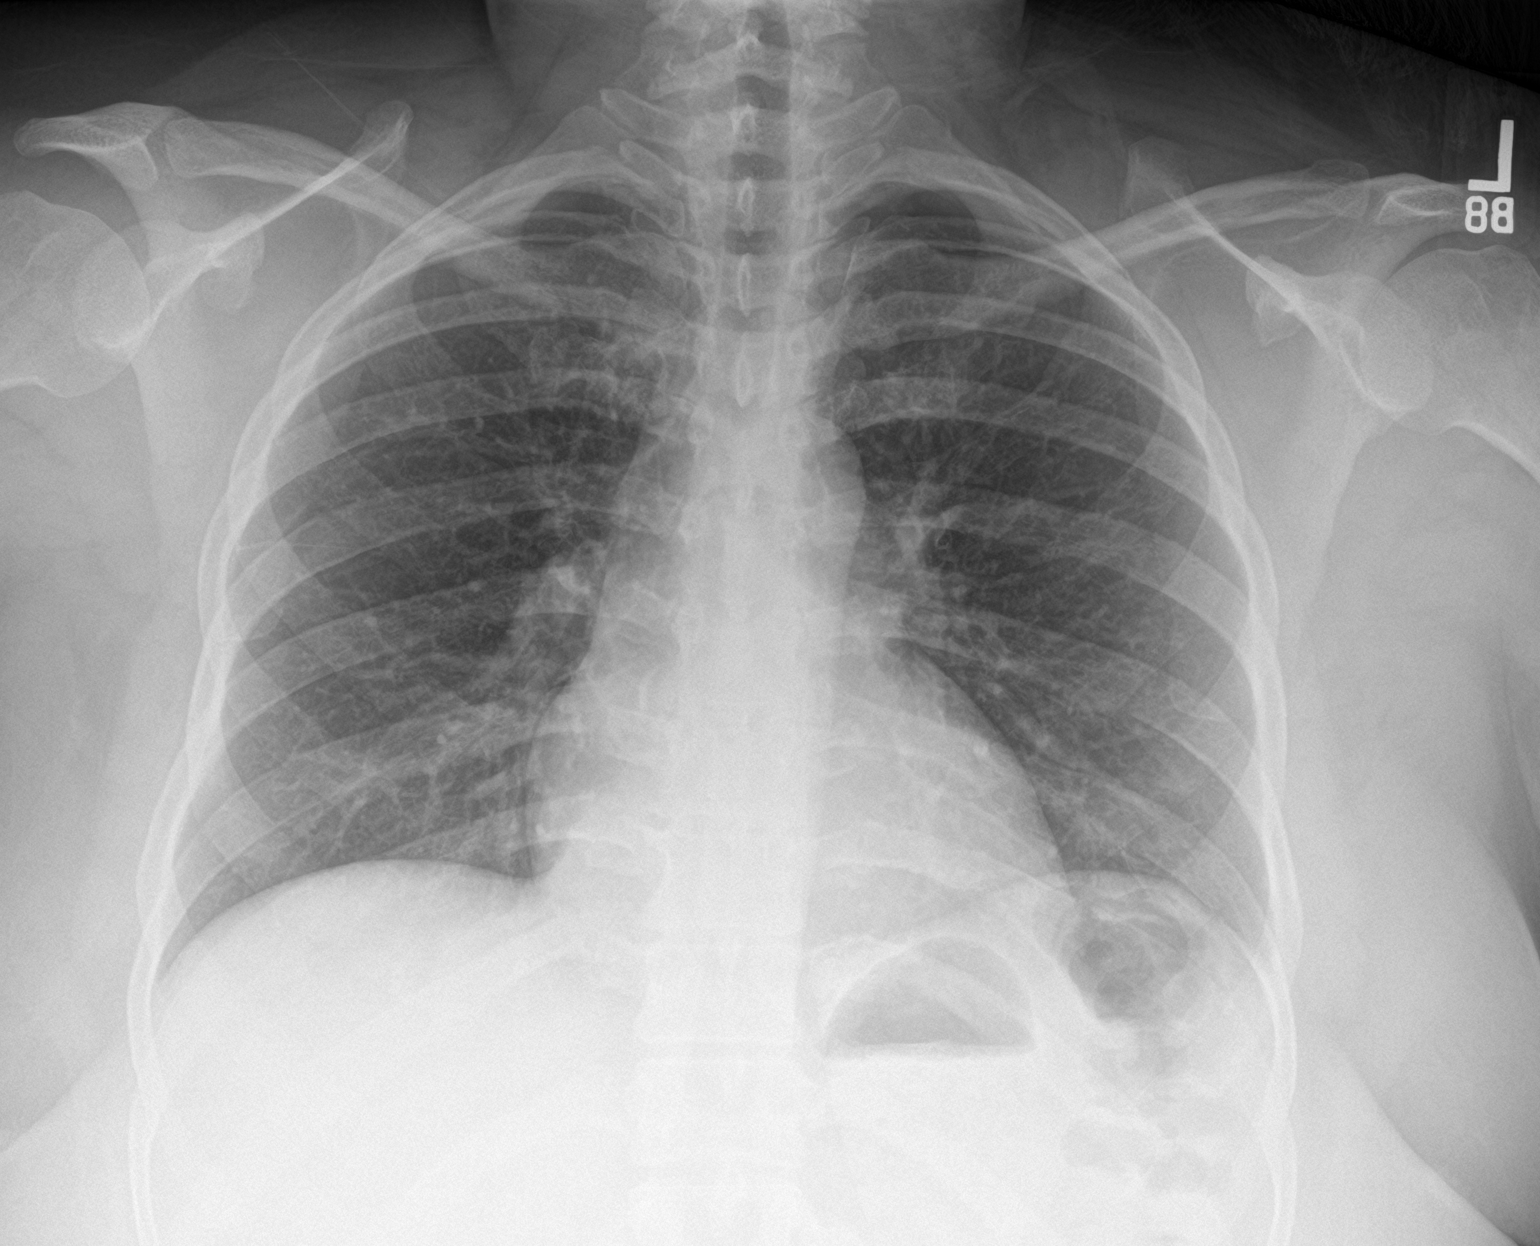

[chest lat]
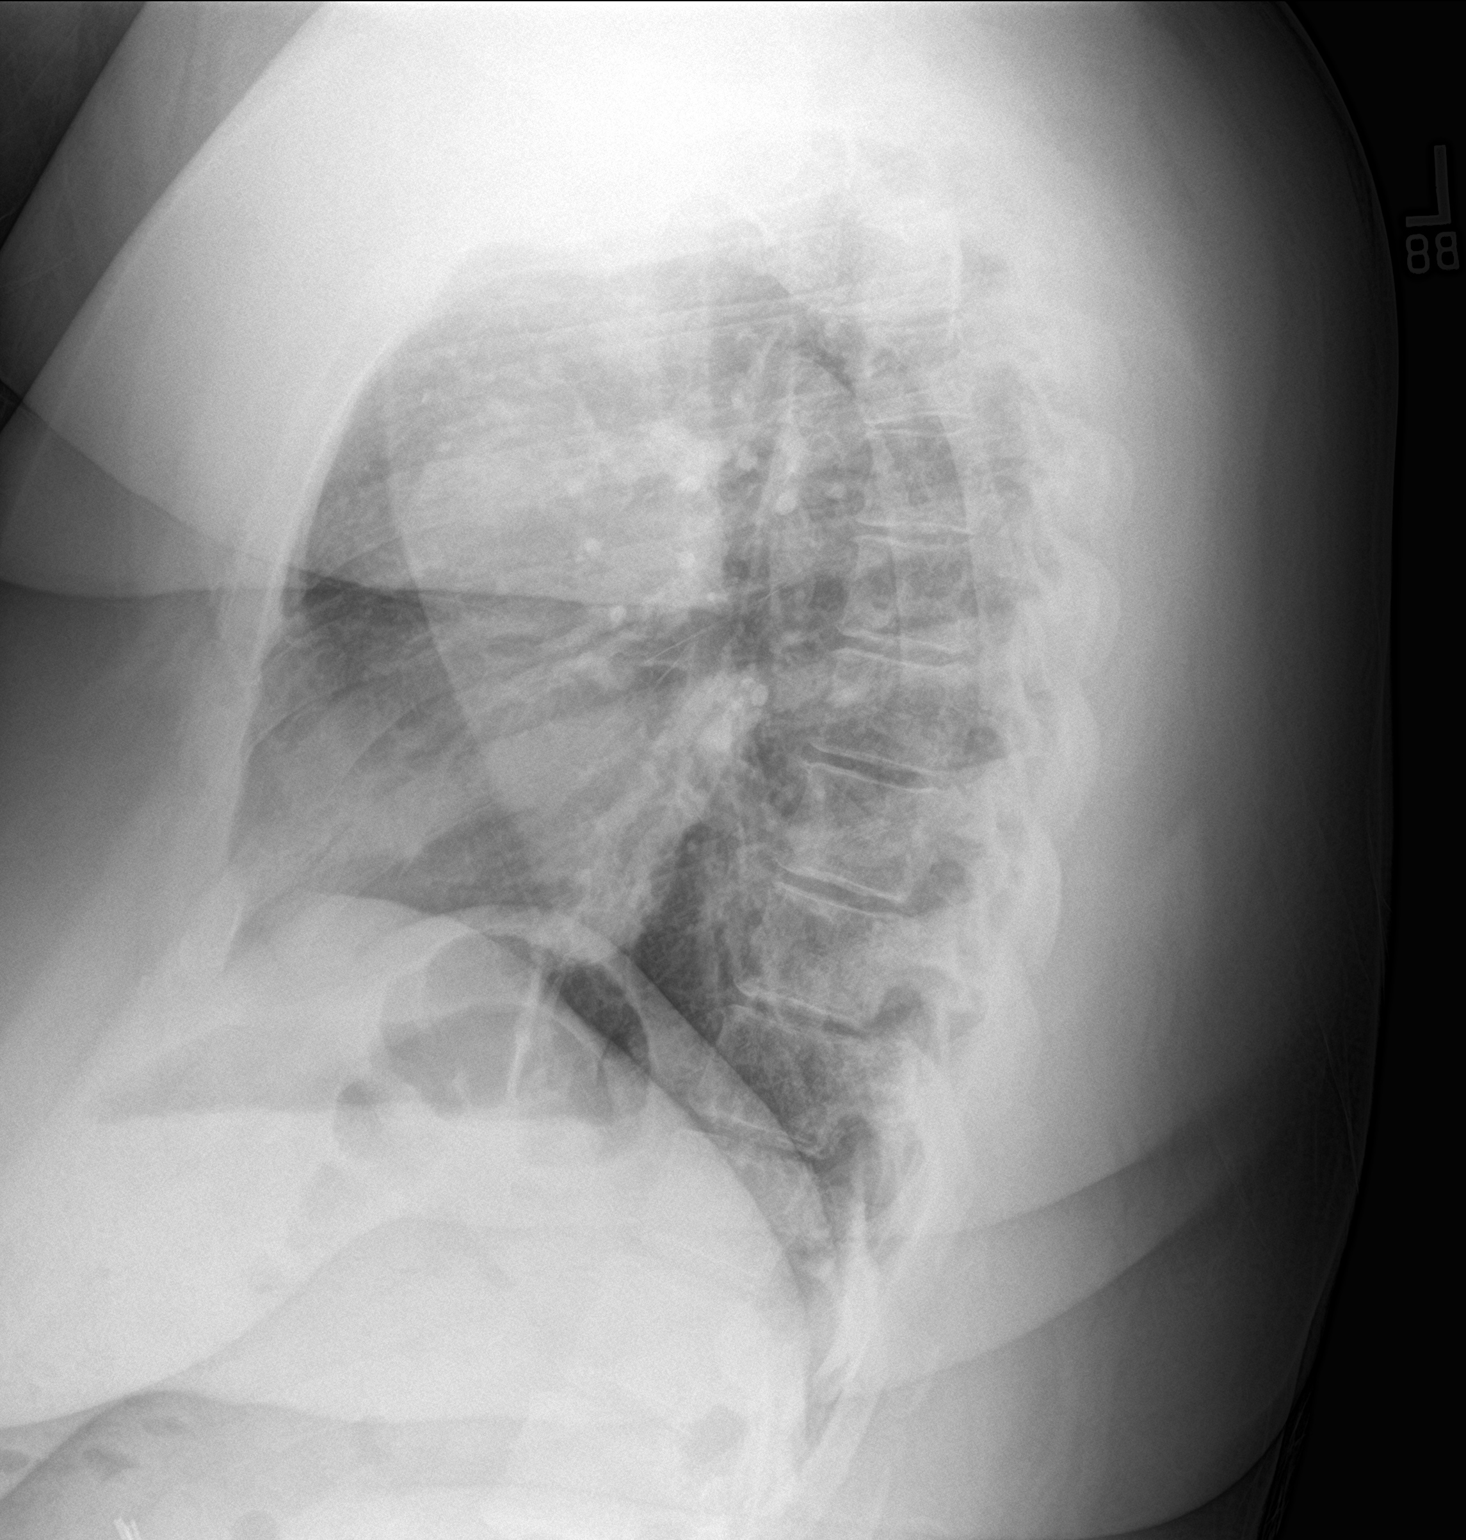

[2 of 2 positions shown; findings below may reference images not displayed]

FINDINGS: The heart size and mediastinal contours are within normal limits.
Both lungs are clear. The visualized skeletal structures are
unremarkable.
IMPRESSION: No active cardiopulmonary disease.
